# Patient Record
Sex: Female | Born: 1945 | Race: Black or African American | Hispanic: No | Smoking: Never smoker
Health system: Southern US, Community
[De-identification: ages and names within clinical notes are randomized; demographics above are authoritative.]

## PROBLEM LIST (undated history)

## (undated) DIAGNOSIS — C50919 Malignant neoplasm of unspecified site of unspecified female breast: Secondary | ICD-10-CM

## (undated) DIAGNOSIS — Z9884 Bariatric surgery status: Secondary | ICD-10-CM

## (undated) DIAGNOSIS — Z9889 Other specified postprocedural states: Secondary | ICD-10-CM

## (undated) DIAGNOSIS — C801 Malignant (primary) neoplasm, unspecified: Secondary | ICD-10-CM

## (undated) DIAGNOSIS — R7302 Impaired glucose tolerance (oral): Secondary | ICD-10-CM

## (undated) DIAGNOSIS — M797 Fibromyalgia: Secondary | ICD-10-CM

## (undated) DIAGNOSIS — M8580 Other specified disorders of bone density and structure, unspecified site: Secondary | ICD-10-CM

## (undated) DIAGNOSIS — Z124 Encounter for screening for malignant neoplasm of cervix: Secondary | ICD-10-CM

## (undated) DIAGNOSIS — I4891 Unspecified atrial fibrillation: Secondary | ICD-10-CM

## (undated) DIAGNOSIS — Z981 Arthrodesis status: Secondary | ICD-10-CM

## (undated) DIAGNOSIS — F319 Bipolar disorder, unspecified: Secondary | ICD-10-CM

## (undated) DIAGNOSIS — T7840XA Allergy, unspecified, initial encounter: Secondary | ICD-10-CM

## (undated) DIAGNOSIS — L910 Hypertrophic scar: Secondary | ICD-10-CM

## (undated) HISTORY — PX: MASTECTOMY: SHX3

## (undated) HISTORY — PX: AUGMENTATION MAMMAPLASTY: SHX5073

## (undated) HISTORY — DX: Arthrodesis status: Z98.1

## (undated) HISTORY — DX: Impaired glucose tolerance (oral): R73.02

## (undated) HISTORY — DX: Fibromyalgia: M79.7

## (undated) HISTORY — DX: Bipolar disorder, unspecified: F31.9

## (undated) HISTORY — DX: Bariatric surgery status: Z98.84

## (undated) HISTORY — PX: GASTRIC BYPASS: SHX52

## (undated) HISTORY — PX: MASTECTOMY, RADICAL: SHX710

## (undated) HISTORY — PX: RECONSTRUCTION BREAST W/ LATISSIMUS DORSI FLAP: SUR1078

## (undated) HISTORY — DX: Allergy, unspecified, initial encounter: T78.40XA

## (undated) HISTORY — DX: Malignant (primary) neoplasm, unspecified: C80.1

## (undated) HISTORY — DX: Other specified postprocedural states: Z98.890

## (undated) HISTORY — DX: Encounter for screening for malignant neoplasm of cervix: Z12.4

## (undated) HISTORY — DX: Hypertrophic scar: L91.0

## (undated) HISTORY — DX: Other specified disorders of bone density and structure, unspecified site: M85.80

---

## 1988-01-11 DIAGNOSIS — Z981 Arthrodesis status: Secondary | ICD-10-CM

## 1988-01-11 HISTORY — DX: Arthrodesis status: Z98.1

## 1996-11-21 ENCOUNTER — Other Ambulatory Visit (HOSPITAL_BASED_OUTPATIENT_CLINIC_OR_DEPARTMENT_OTHER): Payer: Self-pay

## 1996-11-26 LAB — CERVICAL CANCER SCREENING

## 1998-04-23 ENCOUNTER — Other Ambulatory Visit (HOSPITAL_BASED_OUTPATIENT_CLINIC_OR_DEPARTMENT_OTHER): Payer: Self-pay | Admitting: Internal Medicine

## 1998-05-05 LAB — CERVICAL CANCER SCREENING: Cytologic Impression: NEGATIVE

## 1998-11-05 ENCOUNTER — Ambulatory Visit (INDEPENDENT_AMBULATORY_CARE_PROVIDER_SITE_OTHER): Payer: Self-pay | Admitting: Hospice and Palliative Medicine

## 1999-04-05 ENCOUNTER — Ambulatory Visit (HOSPITAL_BASED_OUTPATIENT_CLINIC_OR_DEPARTMENT_OTHER): Payer: Self-pay

## 1999-04-26 ENCOUNTER — Encounter (HOSPITAL_BASED_OUTPATIENT_CLINIC_OR_DEPARTMENT_OTHER): Payer: Self-pay | Admitting: Dermatology

## 1999-05-12 ENCOUNTER — Encounter (HOSPITAL_BASED_OUTPATIENT_CLINIC_OR_DEPARTMENT_OTHER): Payer: Self-pay | Admitting: Dermatology

## 1999-05-24 ENCOUNTER — Encounter (HOSPITAL_BASED_OUTPATIENT_CLINIC_OR_DEPARTMENT_OTHER): Payer: Self-pay | Admitting: Dermatology

## 1999-05-26 ENCOUNTER — Encounter (HOSPITAL_BASED_OUTPATIENT_CLINIC_OR_DEPARTMENT_OTHER): Payer: Self-pay | Admitting: Dermatology

## 1999-05-31 ENCOUNTER — Encounter (HOSPITAL_BASED_OUTPATIENT_CLINIC_OR_DEPARTMENT_OTHER): Payer: Self-pay | Admitting: Dermatology

## 1999-06-30 ENCOUNTER — Encounter (HOSPITAL_BASED_OUTPATIENT_CLINIC_OR_DEPARTMENT_OTHER): Payer: Self-pay | Admitting: Dermatology

## 1999-07-28 ENCOUNTER — Encounter (HOSPITAL_BASED_OUTPATIENT_CLINIC_OR_DEPARTMENT_OTHER): Payer: Self-pay | Admitting: Dermatology

## 1999-08-12 ENCOUNTER — Encounter (HOSPITAL_BASED_OUTPATIENT_CLINIC_OR_DEPARTMENT_OTHER): Payer: No Typology Code available for payment source | Admitting: Internal Medicine

## 1999-09-14 ENCOUNTER — Encounter (HOSPITAL_BASED_OUTPATIENT_CLINIC_OR_DEPARTMENT_OTHER): Payer: Self-pay | Admitting: Internal Medicine

## 1999-09-15 ENCOUNTER — Encounter (HOSPITAL_BASED_OUTPATIENT_CLINIC_OR_DEPARTMENT_OTHER): Payer: No Typology Code available for payment source | Admitting: Dermatology

## 1999-09-22 ENCOUNTER — Other Ambulatory Visit (HOSPITAL_BASED_OUTPATIENT_CLINIC_OR_DEPARTMENT_OTHER): Payer: Self-pay

## 1999-09-22 ENCOUNTER — Encounter (HOSPITAL_BASED_OUTPATIENT_CLINIC_OR_DEPARTMENT_OTHER): Payer: No Typology Code available for payment source | Admitting: Internal Medicine

## 1999-09-28 LAB — CERVICAL CANCER SCREENING: Cytologic Impression: NEGATIVE

## 1999-11-12 ENCOUNTER — Encounter (HOSPITAL_BASED_OUTPATIENT_CLINIC_OR_DEPARTMENT_OTHER): Payer: No Typology Code available for payment source

## 1999-11-24 ENCOUNTER — Encounter (HOSPITAL_BASED_OUTPATIENT_CLINIC_OR_DEPARTMENT_OTHER): Payer: Self-pay | Admitting: Dermatology

## 1999-12-13 ENCOUNTER — Encounter (HOSPITAL_BASED_OUTPATIENT_CLINIC_OR_DEPARTMENT_OTHER): Payer: Self-pay | Admitting: Dermatology

## 1999-12-29 ENCOUNTER — Encounter (HOSPITAL_BASED_OUTPATIENT_CLINIC_OR_DEPARTMENT_OTHER): Payer: No Typology Code available for payment source | Admitting: Internal Medicine

## 1999-12-31 ENCOUNTER — Telehealth (INDEPENDENT_AMBULATORY_CARE_PROVIDER_SITE_OTHER): Payer: Self-pay | Admitting: Internal Medicine

## 1999-12-31 NOTE — Telephone Encounter (Signed)
>>   Mat Carne Fri Dec 31, 1999 4:27 PM  agree with plan.    >> DIANE CROSS Fri Dec 31, 1999 4:12 PM  New pt to [facility]. States she suddenly developed severe eye pain this   afternoon and eye became bloodshot.  She states when she woke up this morning she was fine; eyes did not itch or   burn, no redness or DC.  Rates her pain at 9/10 on pain scale, states she is also having difficulty   seeing out of the eye. She has a cold cloth over the eye to try and relieve   the pain.  Reviewed [facility] Donofrio for Eye Problems. Pt has sudden severe eye pain.  Advised to go to closest ER now for evaluation.  Keep appt tomorrow for f/u. [phone] (home)    >> [facility] Fri Dec 31, 1999 3:53 PM  Pt returning call for nurse re: below. Pt can be reached [phone] for   the rest of the afternoon.    >> PAT CARLTON Fri Dec 31, 1999 3:12 PM  LMTCB on both ph # vm    >> JESSICA WOOD Fri Dec 31, 1999 2:58 PM  >> CALL RECEIVED. Contact: PT  [place] Pt.  Pt states that her eye is very red and painful, and would like to know what she can do to alleviate the sx's until her appt w/ Dr. Domingo Pulse tomorrow afternoon. Pt may be reached at [phone] (home) or [phone] (cell)

## 2000-01-01 ENCOUNTER — Ambulatory Visit (INDEPENDENT_AMBULATORY_CARE_PROVIDER_SITE_OTHER): Payer: Self-pay | Admitting: Internal Medicine

## 2000-01-19 ENCOUNTER — Encounter (HOSPITAL_BASED_OUTPATIENT_CLINIC_OR_DEPARTMENT_OTHER): Payer: No Typology Code available for payment source

## 2000-02-22 ENCOUNTER — Encounter (HOSPITAL_BASED_OUTPATIENT_CLINIC_OR_DEPARTMENT_OTHER): Payer: Self-pay

## 2000-03-01 ENCOUNTER — Encounter (HOSPITAL_BASED_OUTPATIENT_CLINIC_OR_DEPARTMENT_OTHER): Payer: Self-pay | Admitting: Internal Medicine

## 2000-03-13 ENCOUNTER — Encounter (HOSPITAL_BASED_OUTPATIENT_CLINIC_OR_DEPARTMENT_OTHER): Payer: Self-pay | Admitting: Dermatology

## 2000-03-14 ENCOUNTER — Encounter (HOSPITAL_BASED_OUTPATIENT_CLINIC_OR_DEPARTMENT_OTHER): Payer: Self-pay | Admitting: Gynecologic Oncology

## 2000-03-27 ENCOUNTER — Encounter (HOSPITAL_BASED_OUTPATIENT_CLINIC_OR_DEPARTMENT_OTHER): Payer: Self-pay

## 2000-03-30 ENCOUNTER — Encounter (HOSPITAL_BASED_OUTPATIENT_CLINIC_OR_DEPARTMENT_OTHER): Payer: Self-pay

## 2000-03-31 ENCOUNTER — Encounter (HOSPITAL_BASED_OUTPATIENT_CLINIC_OR_DEPARTMENT_OTHER): Payer: Self-pay

## 2000-04-06 ENCOUNTER — Encounter: Payer: MEDICAID | Admitting: Internal Medicine

## 2000-04-17 ENCOUNTER — Encounter (HOSPITAL_BASED_OUTPATIENT_CLINIC_OR_DEPARTMENT_OTHER): Payer: Self-pay | Admitting: Internal Medicine

## 2000-04-24 ENCOUNTER — Encounter: Payer: Self-pay | Admitting: Internal Medicine

## 2000-05-08 ENCOUNTER — Encounter: Payer: Medicare Other | Admitting: Internal Medicine

## 2000-05-25 ENCOUNTER — Encounter: Payer: Medicare Other | Admitting: Psychiatry

## 2000-05-26 ENCOUNTER — Encounter: Payer: Self-pay | Admitting: Registered"

## 2000-06-08 ENCOUNTER — Encounter: Payer: Self-pay | Admitting: Psychiatry

## 2000-06-12 ENCOUNTER — Encounter: Payer: Medicare Other | Admitting: Internal Medicine

## 2000-06-26 ENCOUNTER — Encounter: Payer: Self-pay | Admitting: Internal Medicine

## 2000-06-28 ENCOUNTER — Encounter: Payer: Medicare Other | Admitting: Ophthalmology

## 2000-07-05 ENCOUNTER — Encounter: Payer: Self-pay | Admitting: Registered"

## 2000-09-12 ENCOUNTER — Encounter: Payer: Medicare Other | Admitting: Internal Medicine

## 2000-10-09 ENCOUNTER — Encounter: Payer: Self-pay | Admitting: Internal Medicine

## 2000-10-10 ENCOUNTER — Encounter: Payer: Self-pay | Admitting: Internal Medicine

## 2000-10-18 ENCOUNTER — Encounter: Payer: Medicare Other | Admitting: Internal Medicine

## 2000-10-27 ENCOUNTER — Ambulatory Visit: Payer: Medicare Other | Admitting: Registered"

## 2000-11-15 ENCOUNTER — Encounter: Payer: Self-pay | Admitting: Registered"

## 2000-11-15 ENCOUNTER — Encounter: Payer: Medicare Other | Admitting: Internal Medicine

## 2000-12-19 ENCOUNTER — Encounter: Payer: Self-pay | Admitting: Internal Medicine

## 2000-12-20 ENCOUNTER — Encounter: Payer: Medicare Other | Admitting: Internal Medicine

## 2001-01-10 DIAGNOSIS — C801 Malignant (primary) neoplasm, unspecified: Secondary | ICD-10-CM

## 2001-01-10 DIAGNOSIS — Z9884 Bariatric surgery status: Secondary | ICD-10-CM

## 2001-01-10 HISTORY — DX: Bariatric surgery status: Z98.84

## 2001-01-10 HISTORY — DX: Malignant (primary) neoplasm, unspecified: C80.1

## 2001-02-13 ENCOUNTER — Encounter: Payer: Medicare Other | Admitting: Internal Medicine

## 2001-03-20 ENCOUNTER — Encounter: Payer: Self-pay | Admitting: Internal Medicine

## 2001-03-28 ENCOUNTER — Encounter: Payer: Medicare Other | Admitting: Internal Medicine

## 2001-04-09 ENCOUNTER — Encounter: Payer: Self-pay | Admitting: Obstetrics & Gynecology

## 2001-04-17 ENCOUNTER — Encounter: Payer: Medicare Other | Admitting: Internal Medicine

## 2001-04-17 ENCOUNTER — Encounter: Payer: Medicare Other | Admitting: Registered Nurse

## 2001-05-03 ENCOUNTER — Encounter: Payer: Self-pay | Admitting: Optometrist

## 2001-05-10 ENCOUNTER — Encounter: Payer: Medicare Other | Admitting: Optometrist

## 2001-05-16 ENCOUNTER — Encounter: Payer: Self-pay | Admitting: Internal Medicine

## 2001-06-18 ENCOUNTER — Encounter: Payer: Medicare Other | Admitting: Optometrist

## 2001-09-17 ENCOUNTER — Ambulatory Visit: Payer: Medicare Other

## 2002-03-07 ENCOUNTER — Encounter: Payer: Medicare Other | Admitting: Obstetrics & Gynecology

## 2002-03-07 DIAGNOSIS — N951 Menopausal and female climacteric states: Secondary | ICD-10-CM

## 2002-03-07 DIAGNOSIS — D259 Leiomyoma of uterus, unspecified: Secondary | ICD-10-CM

## 2002-03-18 ENCOUNTER — Encounter (HOSPITAL_BASED_OUTPATIENT_CLINIC_OR_DEPARTMENT_OTHER): Payer: Self-pay | Admitting: Dermatology

## 2002-04-11 ENCOUNTER — Encounter: Payer: Medicare Other | Admitting: Obstetrics & Gynecology

## 2002-04-11 ENCOUNTER — Ambulatory Visit: Payer: Medicare Other

## 2002-04-11 DIAGNOSIS — D251 Intramural leiomyoma of uterus: Secondary | ICD-10-CM

## 2002-05-06 ENCOUNTER — Encounter (HOSPITAL_BASED_OUTPATIENT_CLINIC_OR_DEPARTMENT_OTHER): Payer: MEDICAID | Admitting: Dermatology

## 2002-10-23 ENCOUNTER — Ambulatory Visit (EMERGENCY_DEPARTMENT_HOSPITAL): Payer: Self-pay

## 2002-10-23 ENCOUNTER — Ambulatory Visit (HOSPITAL_BASED_OUTPATIENT_CLINIC_OR_DEPARTMENT_OTHER): Payer: MEDICAID

## 2002-10-23 DIAGNOSIS — R079 Chest pain, unspecified: Secondary | ICD-10-CM

## 2002-10-23 DIAGNOSIS — R002 Palpitations: Secondary | ICD-10-CM

## 2002-11-11 ENCOUNTER — Ambulatory Visit: Payer: Medicare Other

## 2002-11-11 DIAGNOSIS — Z1231 Encounter for screening mammogram for malignant neoplasm of breast: Secondary | ICD-10-CM

## 2002-11-14 ENCOUNTER — Ambulatory Visit: Payer: Medicare Other

## 2002-11-14 DIAGNOSIS — Z13820 Encounter for screening for osteoporosis: Secondary | ICD-10-CM

## 2002-12-02 ENCOUNTER — Other Ambulatory Visit: Payer: Medicare Other

## 2002-12-02 DIAGNOSIS — N63 Unspecified lump in unspecified breast: Secondary | ICD-10-CM

## 2003-12-10 ENCOUNTER — Other Ambulatory Visit: Payer: Medicare Other

## 2003-12-10 DIAGNOSIS — N63 Unspecified lump in unspecified breast: Secondary | ICD-10-CM

## 2003-12-10 DIAGNOSIS — R920 Mammographic microcalcification found on diagnostic imaging of breast: Secondary | ICD-10-CM

## 2004-01-02 ENCOUNTER — Encounter: Payer: PRIVATE HEALTH INSURANCE | Admitting: Optometrist

## 2004-01-02 ENCOUNTER — Encounter: Payer: Self-pay | Admitting: Optometrist

## 2004-01-02 DIAGNOSIS — H524 Presbyopia: Secondary | ICD-10-CM

## 2004-01-02 DIAGNOSIS — H5210 Myopia, unspecified eye: Secondary | ICD-10-CM

## 2004-02-05 ENCOUNTER — Encounter: Payer: PRIVATE HEALTH INSURANCE | Admitting: Obstetrics & Gynecology

## 2004-02-05 DIAGNOSIS — F329 Major depressive disorder, single episode, unspecified: Secondary | ICD-10-CM

## 2004-02-05 DIAGNOSIS — F3289 Other specified depressive episodes: Secondary | ICD-10-CM

## 2004-02-19 ENCOUNTER — Encounter: Payer: Medicare Other | Admitting: Obstetrics & Gynecology

## 2004-02-19 ENCOUNTER — Encounter: Payer: PRIVATE HEALTH INSURANCE | Admitting: Obstetrics & Gynecology

## 2004-02-19 DIAGNOSIS — F3289 Other specified depressive episodes: Secondary | ICD-10-CM

## 2004-02-19 DIAGNOSIS — F329 Major depressive disorder, single episode, unspecified: Secondary | ICD-10-CM

## 2004-03-17 ENCOUNTER — Encounter: Payer: PRIVATE HEALTH INSURANCE | Admitting: Dermatology

## 2004-03-17 DIAGNOSIS — L910 Hypertrophic scar: Secondary | ICD-10-CM

## 2004-05-19 ENCOUNTER — Encounter: Payer: PRIVATE HEALTH INSURANCE | Admitting: Dermatology

## 2006-01-10 DIAGNOSIS — M8580 Other specified disorders of bone density and structure, unspecified site: Secondary | ICD-10-CM

## 2006-01-10 HISTORY — PX: PR UNLISTED PROCEDURE BREAST: 19499

## 2006-01-10 HISTORY — DX: Other specified disorders of bone density and structure, unspecified site: M85.80

## 2006-01-28 LAB — HM MAMMOGRAPHY

## 2006-08-21 ENCOUNTER — Ambulatory Visit (EMERGENCY_DEPARTMENT_HOSPITAL): Payer: PRIVATE HEALTH INSURANCE

## 2006-08-21 DIAGNOSIS — R079 Chest pain, unspecified: Secondary | ICD-10-CM

## 2006-08-21 DIAGNOSIS — R42 Dizziness and giddiness: Secondary | ICD-10-CM

## 2006-08-21 DIAGNOSIS — Z853 Personal history of malignant neoplasm of breast: Secondary | ICD-10-CM

## 2006-08-21 DIAGNOSIS — R0789 Other chest pain: Secondary | ICD-10-CM

## 2006-08-21 DIAGNOSIS — R002 Palpitations: Secondary | ICD-10-CM

## 2006-09-13 ENCOUNTER — Encounter (HOSPITAL_BASED_OUTPATIENT_CLINIC_OR_DEPARTMENT_OTHER): Payer: Self-pay | Admitting: Dermatology

## 2006-09-26 ENCOUNTER — Other Ambulatory Visit (HOSPITAL_BASED_OUTPATIENT_CLINIC_OR_DEPARTMENT_OTHER): Payer: Self-pay | Admitting: Internal Medicine

## 2006-09-26 DIAGNOSIS — C50919 Malignant neoplasm of unspecified site of unspecified female breast: Secondary | ICD-10-CM

## 2006-09-26 DIAGNOSIS — N6039 Fibrosclerosis of unspecified breast: Secondary | ICD-10-CM

## 2006-09-28 LAB — PATHOLOGY, SURGICAL

## 2006-10-04 DIAGNOSIS — C50919 Malignant neoplasm of unspecified site of unspecified female breast: Secondary | ICD-10-CM

## 2006-10-06 ENCOUNTER — Encounter: Payer: PRIVATE HEALTH INSURANCE | Admitting: Internal Medicine

## 2006-10-06 DIAGNOSIS — C50919 Malignant neoplasm of unspecified site of unspecified female breast: Secondary | ICD-10-CM

## 2006-10-16 ENCOUNTER — Telehealth (HOSPITAL_BASED_OUTPATIENT_CLINIC_OR_DEPARTMENT_OTHER): Payer: PRIVATE HEALTH INSURANCE

## 2006-10-25 ENCOUNTER — Encounter (HOSPITAL_BASED_OUTPATIENT_CLINIC_OR_DEPARTMENT_OTHER): Payer: PRIVATE HEALTH INSURANCE

## 2006-10-25 ENCOUNTER — Encounter (HOSPITAL_COMMUNITY): Payer: PRIVATE HEALTH INSURANCE | Admitting: Surgery

## 2006-10-25 ENCOUNTER — Other Ambulatory Visit (HOSPITAL_BASED_OUTPATIENT_CLINIC_OR_DEPARTMENT_OTHER): Payer: Self-pay | Admitting: Surgery

## 2006-10-25 DIAGNOSIS — Z170 Estrogen receptor positive status [ER+]: Secondary | ICD-10-CM

## 2006-10-25 DIAGNOSIS — C50919 Malignant neoplasm of unspecified site of unspecified female breast: Secondary | ICD-10-CM

## 2006-10-25 DIAGNOSIS — C50219 Malignant neoplasm of upper-inner quadrant of unspecified female breast: Secondary | ICD-10-CM

## 2006-10-31 ENCOUNTER — Encounter (HOSPITAL_BASED_OUTPATIENT_CLINIC_OR_DEPARTMENT_OTHER): Payer: PRIVATE HEALTH INSURANCE | Admitting: Surgery

## 2006-10-31 DIAGNOSIS — Z483 Aftercare following surgery for neoplasm: Secondary | ICD-10-CM

## 2006-10-31 DIAGNOSIS — C50319 Malignant neoplasm of lower-inner quadrant of unspecified female breast: Secondary | ICD-10-CM

## 2006-10-31 DIAGNOSIS — Z9010 Acquired absence of unspecified breast and nipple: Secondary | ICD-10-CM

## 2006-11-23 ENCOUNTER — Encounter: Payer: PRIVATE HEALTH INSURANCE | Admitting: Internal Medicine

## 2006-11-23 ENCOUNTER — Other Ambulatory Visit: Payer: PRIVATE HEALTH INSURANCE

## 2006-11-23 DIAGNOSIS — C50919 Malignant neoplasm of unspecified site of unspecified female breast: Secondary | ICD-10-CM

## 2006-12-11 ENCOUNTER — Encounter: Payer: PRIVATE HEALTH INSURANCE | Admitting: Internal Medicine

## 2006-12-14 ENCOUNTER — Encounter: Payer: PRIVATE HEALTH INSURANCE | Admitting: Internal Medicine

## 2006-12-18 ENCOUNTER — Encounter: Payer: PRIVATE HEALTH INSURANCE | Admitting: Internal Medicine

## 2006-12-18 DIAGNOSIS — C50919 Malignant neoplasm of unspecified site of unspecified female breast: Secondary | ICD-10-CM

## 2007-01-11 DIAGNOSIS — Z9889 Other specified postprocedural states: Secondary | ICD-10-CM

## 2007-01-11 HISTORY — DX: Other specified postprocedural states: Z98.890

## 2007-01-29 LAB — HM COLONOSCOPY: HM Colonoscopy: NORMAL

## 2007-07-05 ENCOUNTER — Other Ambulatory Visit (HOSPITAL_BASED_OUTPATIENT_CLINIC_OR_DEPARTMENT_OTHER): Payer: PRIVATE HEALTH INSURANCE

## 2007-07-05 ENCOUNTER — Encounter (HOSPITAL_BASED_OUTPATIENT_CLINIC_OR_DEPARTMENT_OTHER): Payer: PRIVATE HEALTH INSURANCE | Admitting: Internal Medicine

## 2007-07-05 DIAGNOSIS — Z853 Personal history of malignant neoplasm of breast: Secondary | ICD-10-CM

## 2007-07-26 ENCOUNTER — Encounter (HOSPITAL_BASED_OUTPATIENT_CLINIC_OR_DEPARTMENT_OTHER): Payer: PRIVATE HEALTH INSURANCE | Admitting: Internal Medicine

## 2007-11-29 ENCOUNTER — Encounter (HOSPITAL_BASED_OUTPATIENT_CLINIC_OR_DEPARTMENT_OTHER): Payer: PRIVATE HEALTH INSURANCE | Admitting: Internal Medicine

## 2007-11-29 ENCOUNTER — Other Ambulatory Visit (HOSPITAL_BASED_OUTPATIENT_CLINIC_OR_DEPARTMENT_OTHER): Payer: PRIVATE HEALTH INSURANCE

## 2007-11-29 DIAGNOSIS — C50919 Malignant neoplasm of unspecified site of unspecified female breast: Secondary | ICD-10-CM

## 2007-12-19 ENCOUNTER — Ambulatory Visit (HOSPITAL_BASED_OUTPATIENT_CLINIC_OR_DEPARTMENT_OTHER): Payer: Self-pay

## 2008-01-25 ENCOUNTER — Ambulatory Visit (HOSPITAL_BASED_OUTPATIENT_CLINIC_OR_DEPARTMENT_OTHER): Payer: Medicare Other | Admitting: Physical Therapist

## 2008-02-21 ENCOUNTER — Ambulatory Visit (HOSPITAL_BASED_OUTPATIENT_CLINIC_OR_DEPARTMENT_OTHER): Payer: Medicare Other | Admitting: Physical Therapist

## 2008-02-22 ENCOUNTER — Ambulatory Visit (HOSPITAL_BASED_OUTPATIENT_CLINIC_OR_DEPARTMENT_OTHER): Payer: Medicare Other | Admitting: Physical Therapist

## 2008-02-28 ENCOUNTER — Ambulatory Visit (HOSPITAL_BASED_OUTPATIENT_CLINIC_OR_DEPARTMENT_OTHER): Payer: Medicare Other | Admitting: Physical Therapist

## 2008-02-29 ENCOUNTER — Ambulatory Visit (HOSPITAL_BASED_OUTPATIENT_CLINIC_OR_DEPARTMENT_OTHER): Payer: Medicare Other | Admitting: Physical Therapist

## 2008-03-06 ENCOUNTER — Ambulatory Visit (HOSPITAL_BASED_OUTPATIENT_CLINIC_OR_DEPARTMENT_OTHER): Payer: Medicare Other | Admitting: Physical Therapist

## 2008-03-07 ENCOUNTER — Ambulatory Visit (HOSPITAL_BASED_OUTPATIENT_CLINIC_OR_DEPARTMENT_OTHER): Payer: Medicare Other | Admitting: Physical Therapist

## 2008-03-13 ENCOUNTER — Ambulatory Visit (HOSPITAL_BASED_OUTPATIENT_CLINIC_OR_DEPARTMENT_OTHER): Payer: Medicare Other | Admitting: Physical Therapist

## 2008-03-14 ENCOUNTER — Ambulatory Visit (HOSPITAL_BASED_OUTPATIENT_CLINIC_OR_DEPARTMENT_OTHER): Payer: Medicare Other | Admitting: Physical Therapist

## 2008-03-21 ENCOUNTER — Ambulatory Visit (HOSPITAL_BASED_OUTPATIENT_CLINIC_OR_DEPARTMENT_OTHER): Payer: Medicare Other | Admitting: Physical Therapist

## 2008-03-28 ENCOUNTER — Ambulatory Visit (HOSPITAL_BASED_OUTPATIENT_CLINIC_OR_DEPARTMENT_OTHER): Payer: Self-pay | Admitting: Physical Therapist

## 2008-04-04 ENCOUNTER — Ambulatory Visit (HOSPITAL_BASED_OUTPATIENT_CLINIC_OR_DEPARTMENT_OTHER): Payer: Self-pay | Admitting: Physical Therapist

## 2008-04-08 ENCOUNTER — Ambulatory Visit (HOSPITAL_BASED_OUTPATIENT_CLINIC_OR_DEPARTMENT_OTHER): Payer: Medicare Other

## 2008-04-11 ENCOUNTER — Ambulatory Visit (HOSPITAL_BASED_OUTPATIENT_CLINIC_OR_DEPARTMENT_OTHER): Payer: Medicare Other | Admitting: Physical Therapist

## 2008-04-15 ENCOUNTER — Ambulatory Visit (HOSPITAL_BASED_OUTPATIENT_CLINIC_OR_DEPARTMENT_OTHER): Payer: Medicare Other

## 2008-04-16 ENCOUNTER — Other Ambulatory Visit: Payer: Self-pay | Admitting: Internal Medicine

## 2008-04-16 ENCOUNTER — Other Ambulatory Visit (HOSPITAL_BASED_OUTPATIENT_CLINIC_OR_DEPARTMENT_OTHER): Payer: Medicare Other

## 2008-04-16 DIAGNOSIS — N63 Unspecified lump in unspecified breast: Secondary | ICD-10-CM

## 2008-04-16 LAB — US EXAM, BREAST(S)

## 2008-04-18 ENCOUNTER — Other Ambulatory Visit (HOSPITAL_BASED_OUTPATIENT_CLINIC_OR_DEPARTMENT_OTHER): Payer: Self-pay | Admitting: Diagnostic Radiology

## 2008-04-18 ENCOUNTER — Ambulatory Visit (HOSPITAL_BASED_OUTPATIENT_CLINIC_OR_DEPARTMENT_OTHER): Payer: Medicare Other | Admitting: Physical Therapist

## 2008-04-18 ENCOUNTER — Encounter (HOSPITAL_BASED_OUTPATIENT_CLINIC_OR_DEPARTMENT_OTHER): Payer: Medicare Other

## 2008-04-18 ENCOUNTER — Other Ambulatory Visit: Payer: Self-pay | Admitting: Internal Medicine

## 2008-04-18 DIAGNOSIS — N61 Mastitis without abscess: Secondary | ICD-10-CM

## 2008-04-18 DIAGNOSIS — C50919 Malignant neoplasm of unspecified site of unspecified female breast: Secondary | ICD-10-CM

## 2008-04-18 DIAGNOSIS — N6009 Solitary cyst of unspecified breast: Secondary | ICD-10-CM

## 2008-04-18 DIAGNOSIS — N63 Unspecified lump in unspecified breast: Secondary | ICD-10-CM

## 2008-04-21 LAB — PATHOLOGY, SURGICAL

## 2008-04-22 LAB — PR US BREAST FINE NEEDLE ASPIRATE

## 2008-04-22 LAB — PR SONO GUIDE NEEDLE BIOPSY

## 2008-04-23 ENCOUNTER — Other Ambulatory Visit: Payer: Self-pay | Admitting: Internal Medicine

## 2008-04-23 ENCOUNTER — Other Ambulatory Visit (HOSPITAL_BASED_OUTPATIENT_CLINIC_OR_DEPARTMENT_OTHER): Payer: Medicare Other

## 2008-04-23 ENCOUNTER — Ambulatory Visit (HOSPITAL_BASED_OUTPATIENT_CLINIC_OR_DEPARTMENT_OTHER): Payer: Medicare Other

## 2008-04-23 DIAGNOSIS — Z853 Personal history of malignant neoplasm of breast: Secondary | ICD-10-CM

## 2008-04-23 DIAGNOSIS — C50919 Malignant neoplasm of unspecified site of unspecified female breast: Secondary | ICD-10-CM

## 2008-04-23 DIAGNOSIS — R928 Other abnormal and inconclusive findings on diagnostic imaging of breast: Secondary | ICD-10-CM

## 2008-04-24 LAB — MRI, BOTH BREASTS

## 2008-04-25 ENCOUNTER — Ambulatory Visit (HOSPITAL_BASED_OUTPATIENT_CLINIC_OR_DEPARTMENT_OTHER): Payer: Medicare Other | Admitting: Physical Therapist

## 2008-04-27 ENCOUNTER — Ambulatory Visit (EMERGENCY_DEPARTMENT_HOSPITAL): Payer: Medicare Other

## 2008-04-27 DIAGNOSIS — IMO0002 Reserved for concepts with insufficient information to code with codable children: Secondary | ICD-10-CM

## 2008-04-27 DIAGNOSIS — C50919 Malignant neoplasm of unspecified site of unspecified female breast: Secondary | ICD-10-CM

## 2008-04-29 ENCOUNTER — Encounter (HOSPITAL_BASED_OUTPATIENT_CLINIC_OR_DEPARTMENT_OTHER): Payer: Medicare Other | Admitting: Surgery

## 2008-04-29 ENCOUNTER — Ambulatory Visit (HOSPITAL_BASED_OUTPATIENT_CLINIC_OR_DEPARTMENT_OTHER): Payer: Medicare Other

## 2008-04-29 DIAGNOSIS — C50119 Malignant neoplasm of central portion of unspecified female breast: Secondary | ICD-10-CM

## 2008-05-02 ENCOUNTER — Ambulatory Visit (HOSPITAL_BASED_OUTPATIENT_CLINIC_OR_DEPARTMENT_OTHER): Payer: Medicare Other | Admitting: Physical Therapist

## 2008-05-06 ENCOUNTER — Ambulatory Visit (HOSPITAL_BASED_OUTPATIENT_CLINIC_OR_DEPARTMENT_OTHER): Payer: Medicare Other

## 2008-05-13 ENCOUNTER — Other Ambulatory Visit (HOSPITAL_BASED_OUTPATIENT_CLINIC_OR_DEPARTMENT_OTHER): Payer: Self-pay | Admitting: Surgery

## 2008-05-13 ENCOUNTER — Encounter (HOSPITAL_BASED_OUTPATIENT_CLINIC_OR_DEPARTMENT_OTHER): Payer: Medicare Other | Admitting: Surgery

## 2008-05-13 DIAGNOSIS — C50919 Malignant neoplasm of unspecified site of unspecified female breast: Secondary | ICD-10-CM

## 2008-05-13 DIAGNOSIS — C50119 Malignant neoplasm of central portion of unspecified female breast: Secondary | ICD-10-CM

## 2008-05-22 ENCOUNTER — Other Ambulatory Visit (HOSPITAL_BASED_OUTPATIENT_CLINIC_OR_DEPARTMENT_OTHER): Payer: Self-pay | Admitting: Diagnostic Radiology

## 2008-05-22 ENCOUNTER — Other Ambulatory Visit (HOSPITAL_BASED_OUTPATIENT_CLINIC_OR_DEPARTMENT_OTHER): Payer: Medicare Other

## 2008-05-22 ENCOUNTER — Other Ambulatory Visit: Payer: Self-pay | Admitting: Surgery

## 2008-05-22 ENCOUNTER — Encounter (HOSPITAL_BASED_OUTPATIENT_CLINIC_OR_DEPARTMENT_OTHER): Payer: Medicare Other

## 2008-05-22 DIAGNOSIS — N6459 Other signs and symptoms in breast: Secondary | ICD-10-CM

## 2008-05-22 DIAGNOSIS — N63 Unspecified lump in unspecified breast: Secondary | ICD-10-CM

## 2008-05-22 DIAGNOSIS — Z853 Personal history of malignant neoplasm of breast: Secondary | ICD-10-CM

## 2008-05-22 DIAGNOSIS — R928 Other abnormal and inconclusive findings on diagnostic imaging of breast: Secondary | ICD-10-CM

## 2008-05-22 LAB — US EXAM, BREAST(S)

## 2008-05-23 LAB — PATHOLOGY, SURGICAL

## 2008-06-06 LAB — MAMMOGRAM, RIGHT BREAST DIAGNOSTIC

## 2008-06-06 LAB — PR SONO GUIDE NEEDLE BIOPSY

## 2008-06-17 ENCOUNTER — Encounter (HOSPITAL_BASED_OUTPATIENT_CLINIC_OR_DEPARTMENT_OTHER): Payer: Medicare Other | Admitting: Surgery

## 2008-06-17 ENCOUNTER — Encounter (HOSPITAL_BASED_OUTPATIENT_CLINIC_OR_DEPARTMENT_OTHER): Payer: Medicare Other | Admitting: Plastic and Reconstructive Surgery

## 2008-06-20 ENCOUNTER — Encounter (HOSPITAL_BASED_OUTPATIENT_CLINIC_OR_DEPARTMENT_OTHER): Payer: Medicare Other | Admitting: Surgery

## 2008-06-20 ENCOUNTER — Encounter (HOSPITAL_BASED_OUTPATIENT_CLINIC_OR_DEPARTMENT_OTHER): Payer: Medicare Other | Admitting: Plastic and Reconstructive Surgery

## 2008-06-24 ENCOUNTER — Encounter (HOSPITAL_BASED_OUTPATIENT_CLINIC_OR_DEPARTMENT_OTHER): Payer: Medicare Other | Admitting: Surgery

## 2008-07-31 ENCOUNTER — Ambulatory Visit (HOSPITAL_BASED_OUTPATIENT_CLINIC_OR_DEPARTMENT_OTHER): Admit: 2008-07-31 | Discharge: 2008-07-31 | Disposition: A | Discharge status: Home / Self Care | Payer: Self-pay

## 2008-11-27 ENCOUNTER — Ambulatory Visit: Payer: Medicare Other | Attending: Surgery

## 2008-11-27 ENCOUNTER — Other Ambulatory Visit: Payer: Self-pay | Admitting: Surgery

## 2008-11-27 DIAGNOSIS — Z853 Personal history of malignant neoplasm of breast: Secondary | ICD-10-CM | POA: Insufficient documentation

## 2008-11-29 LAB — US EXAM, BREAST(S)

## 2009-07-10 DIAGNOSIS — Z124 Encounter for screening for malignant neoplasm of cervix: Secondary | ICD-10-CM

## 2009-07-10 HISTORY — DX: Encounter for screening for malignant neoplasm of cervix: Z12.4

## 2009-07-28 LAB — HM PAP SMEAR: HM Pap smear: NORMAL

## 2009-08-24 ENCOUNTER — Ambulatory Visit: Payer: Medicare Other | Attending: Internal Medicine | Admitting: Internal Medicine

## 2009-08-24 DIAGNOSIS — M2550 Pain in unspecified joint: Secondary | ICD-10-CM | POA: Insufficient documentation

## 2009-08-24 DIAGNOSIS — F329 Major depressive disorder, single episode, unspecified: Secondary | ICD-10-CM | POA: Insufficient documentation

## 2009-08-24 DIAGNOSIS — C50919 Malignant neoplasm of unspecified site of unspecified female breast: Secondary | ICD-10-CM

## 2009-08-24 DIAGNOSIS — Z79811 Long term (current) use of aromatase inhibitors: Secondary | ICD-10-CM | POA: Insufficient documentation

## 2009-08-24 DIAGNOSIS — Z9010 Acquired absence of unspecified breast and nipple: Secondary | ICD-10-CM | POA: Insufficient documentation

## 2009-08-24 DIAGNOSIS — IMO0001 Reserved for inherently not codable concepts without codable children: Secondary | ICD-10-CM | POA: Insufficient documentation

## 2009-08-24 DIAGNOSIS — Z853 Personal history of malignant neoplasm of breast: Secondary | ICD-10-CM | POA: Insufficient documentation

## 2009-08-24 DIAGNOSIS — F3289 Other specified depressive episodes: Secondary | ICD-10-CM | POA: Insufficient documentation

## 2009-08-24 DIAGNOSIS — N951 Menopausal and female climacteric states: Secondary | ICD-10-CM | POA: Insufficient documentation

## 2009-08-24 DIAGNOSIS — Z978 Presence of other specified devices: Secondary | ICD-10-CM | POA: Insufficient documentation

## 2009-08-30 ENCOUNTER — Other Ambulatory Visit: Payer: Self-pay

## 2009-09-06 ENCOUNTER — Other Ambulatory Visit: Payer: Self-pay

## 2009-10-31 ENCOUNTER — Other Ambulatory Visit: Payer: Self-pay

## 2009-11-24 ENCOUNTER — Encounter (HOSPITAL_BASED_OUTPATIENT_CLINIC_OR_DEPARTMENT_OTHER): Payer: Medicare Other | Admitting: Internal Medicine

## 2009-11-30 ENCOUNTER — Other Ambulatory Visit: Payer: Self-pay

## 2009-12-01 ENCOUNTER — Ambulatory Visit: Payer: Medicare Other | Attending: Internal Medicine

## 2009-12-01 ENCOUNTER — Encounter (HOSPITAL_BASED_OUTPATIENT_CLINIC_OR_DEPARTMENT_OTHER): Payer: Medicare Other | Admitting: Internal Medicine

## 2009-12-01 ENCOUNTER — Other Ambulatory Visit: Payer: Self-pay | Admitting: Internal Medicine

## 2009-12-01 ENCOUNTER — Ambulatory Visit (HOSPITAL_BASED_OUTPATIENT_CLINIC_OR_DEPARTMENT_OTHER): Payer: Medicare Other

## 2009-12-01 DIAGNOSIS — Z853 Personal history of malignant neoplasm of breast: Secondary | ICD-10-CM

## 2009-12-01 DIAGNOSIS — C50919 Malignant neoplasm of unspecified site of unspecified female breast: Secondary | ICD-10-CM | POA: Insufficient documentation

## 2009-12-01 LAB — MAMMOGRAM, RIGHT BREAST DIAGNOSTIC

## 2010-01-01 ENCOUNTER — Other Ambulatory Visit: Payer: Self-pay

## 2010-01-02 ENCOUNTER — Other Ambulatory Visit: Payer: Self-pay

## 2010-01-03 ENCOUNTER — Other Ambulatory Visit: Payer: Self-pay

## 2010-01-04 ENCOUNTER — Other Ambulatory Visit: Payer: Self-pay

## 2010-01-05 ENCOUNTER — Other Ambulatory Visit: Payer: Self-pay

## 2010-01-06 ENCOUNTER — Other Ambulatory Visit: Payer: Self-pay

## 2010-01-07 ENCOUNTER — Other Ambulatory Visit: Payer: Self-pay

## 2010-01-08 ENCOUNTER — Other Ambulatory Visit: Payer: Self-pay

## 2010-01-09 ENCOUNTER — Other Ambulatory Visit: Payer: Self-pay

## 2010-01-10 ENCOUNTER — Other Ambulatory Visit: Payer: Self-pay

## 2010-01-11 ENCOUNTER — Other Ambulatory Visit: Payer: Self-pay

## 2010-01-12 ENCOUNTER — Other Ambulatory Visit: Payer: Self-pay

## 2010-01-13 ENCOUNTER — Other Ambulatory Visit: Payer: Self-pay

## 2010-01-14 ENCOUNTER — Other Ambulatory Visit: Payer: Self-pay

## 2010-01-15 ENCOUNTER — Other Ambulatory Visit: Payer: Self-pay

## 2010-01-16 ENCOUNTER — Other Ambulatory Visit: Payer: Self-pay

## 2010-01-17 ENCOUNTER — Other Ambulatory Visit: Payer: Self-pay

## 2010-01-18 ENCOUNTER — Other Ambulatory Visit: Payer: Self-pay

## 2010-01-19 ENCOUNTER — Other Ambulatory Visit: Payer: Self-pay

## 2010-01-21 ENCOUNTER — Other Ambulatory Visit: Payer: Self-pay

## 2010-01-23 ENCOUNTER — Other Ambulatory Visit: Payer: Self-pay

## 2010-01-24 ENCOUNTER — Other Ambulatory Visit: Payer: Self-pay

## 2010-01-25 ENCOUNTER — Other Ambulatory Visit: Payer: Self-pay

## 2010-01-27 ENCOUNTER — Other Ambulatory Visit: Payer: Self-pay

## 2010-01-28 ENCOUNTER — Other Ambulatory Visit: Payer: Self-pay

## 2010-01-28 LAB — HM MAMMOGRAPHY

## 2010-01-29 ENCOUNTER — Other Ambulatory Visit: Payer: Self-pay

## 2010-02-02 ENCOUNTER — Ambulatory Visit: Payer: Medicare Other | Attending: Internal Medicine | Admitting: Internal Medicine

## 2010-02-02 DIAGNOSIS — R928 Other abnormal and inconclusive findings on diagnostic imaging of breast: Secondary | ICD-10-CM | POA: Insufficient documentation

## 2010-02-02 DIAGNOSIS — F329 Major depressive disorder, single episode, unspecified: Secondary | ICD-10-CM | POA: Insufficient documentation

## 2010-02-02 DIAGNOSIS — Z79811 Long term (current) use of aromatase inhibitors: Secondary | ICD-10-CM | POA: Insufficient documentation

## 2010-02-02 DIAGNOSIS — F3289 Other specified depressive episodes: Secondary | ICD-10-CM | POA: Insufficient documentation

## 2010-02-02 DIAGNOSIS — C50919 Malignant neoplasm of unspecified site of unspecified female breast: Secondary | ICD-10-CM

## 2010-02-02 DIAGNOSIS — D0590 Unspecified type of carcinoma in situ of unspecified breast: Secondary | ICD-10-CM | POA: Insufficient documentation

## 2010-02-02 DIAGNOSIS — L910 Hypertrophic scar: Secondary | ICD-10-CM | POA: Insufficient documentation

## 2010-02-16 ENCOUNTER — Other Ambulatory Visit (HOSPITAL_BASED_OUTPATIENT_CLINIC_OR_DEPARTMENT_OTHER): Payer: Medicare Other

## 2010-02-19 ENCOUNTER — Ambulatory Visit: Payer: Medicare Other | Attending: Plastic and Reconstructive Surgery | Admitting: Plastic and Reconstructive Surgery

## 2010-02-19 DIAGNOSIS — L910 Hypertrophic scar: Secondary | ICD-10-CM | POA: Insufficient documentation

## 2010-03-08 ENCOUNTER — Encounter (HOSPITAL_BASED_OUTPATIENT_CLINIC_OR_DEPARTMENT_OTHER): Payer: Self-pay

## 2010-03-18 ENCOUNTER — Ambulatory Visit: Payer: Medicare Other | Attending: Plastic and Reconstructive Surgery | Admitting: Plastic and Reconstructive Surgery

## 2010-03-18 DIAGNOSIS — L910 Hypertrophic scar: Secondary | ICD-10-CM | POA: Insufficient documentation

## 2010-04-23 ENCOUNTER — Ambulatory Visit: Payer: Medicare Other | Attending: Plastic and Reconstructive Surgery | Admitting: Plastic and Reconstructive Surgery

## 2010-04-23 DIAGNOSIS — L910 Hypertrophic scar: Secondary | ICD-10-CM | POA: Insufficient documentation

## 2010-04-27 ENCOUNTER — Ambulatory Visit (HOSPITAL_BASED_OUTPATIENT_CLINIC_OR_DEPARTMENT_OTHER): Payer: Medicare Other | Admitting: Plastic and Reconstructive Surgery

## 2010-04-28 ENCOUNTER — Ambulatory Visit (HOSPITAL_BASED_OUTPATIENT_CLINIC_OR_DEPARTMENT_OTHER): Payer: Medicare Other | Admitting: Plastic and Reconstructive Surgery

## 2010-05-12 ENCOUNTER — Ambulatory Visit: Payer: Medicare Other | Attending: Plastic and Reconstructive Surgery | Admitting: Plastic and Reconstructive Surgery

## 2010-05-12 DIAGNOSIS — L910 Hypertrophic scar: Secondary | ICD-10-CM | POA: Insufficient documentation

## 2010-05-28 ENCOUNTER — Ambulatory Visit: Payer: Medicare Other | Attending: Plastic and Reconstructive Surgery | Admitting: Plastic and Reconstructive Surgery

## 2010-05-28 DIAGNOSIS — L910 Hypertrophic scar: Secondary | ICD-10-CM | POA: Insufficient documentation

## 2010-06-09 ENCOUNTER — Other Ambulatory Visit (HOSPITAL_BASED_OUTPATIENT_CLINIC_OR_DEPARTMENT_OTHER): Payer: Self-pay | Admitting: Plastic and Reconstructive Surgery

## 2010-06-09 ENCOUNTER — Ambulatory Visit (HOSPITAL_BASED_OUTPATIENT_CLINIC_OR_DEPARTMENT_OTHER): Payer: Medicare Other | Admitting: Plastic and Reconstructive Surgery

## 2010-06-09 ENCOUNTER — Ambulatory Visit
Admission: RE | Admit: 2010-06-09 | Discharge: 2010-06-09 | Disposition: A | Discharge status: Home / Self Care | Payer: Medicare Other | Attending: Plastic and Reconstructive Surgery | Admitting: Plastic and Reconstructive Surgery

## 2010-06-09 DIAGNOSIS — L910 Hypertrophic scar: Secondary | ICD-10-CM

## 2010-06-09 DIAGNOSIS — L905 Scar conditions and fibrosis of skin: Secondary | ICD-10-CM | POA: Insufficient documentation

## 2010-06-09 DIAGNOSIS — F411 Generalized anxiety disorder: Secondary | ICD-10-CM | POA: Insufficient documentation

## 2010-06-09 DIAGNOSIS — Z853 Personal history of malignant neoplasm of breast: Secondary | ICD-10-CM | POA: Insufficient documentation

## 2010-06-09 DIAGNOSIS — F329 Major depressive disorder, single episode, unspecified: Secondary | ICD-10-CM | POA: Insufficient documentation

## 2010-06-09 DIAGNOSIS — F3289 Other specified depressive episodes: Secondary | ICD-10-CM | POA: Insufficient documentation

## 2010-06-11 LAB — PATHOLOGY, SURGICAL

## 2010-06-18 ENCOUNTER — Ambulatory Visit: Payer: Medicare Other | Attending: Plastic and Reconstructive Surgery | Admitting: Plastic and Reconstructive Surgery

## 2010-06-18 DIAGNOSIS — L910 Hypertrophic scar: Secondary | ICD-10-CM

## 2010-06-18 DIAGNOSIS — Z9884 Bariatric surgery status: Secondary | ICD-10-CM | POA: Insufficient documentation

## 2010-06-18 DIAGNOSIS — Z978 Presence of other specified devices: Secondary | ICD-10-CM | POA: Insufficient documentation

## 2010-06-22 ENCOUNTER — Ambulatory Visit (HOSPITAL_BASED_OUTPATIENT_CLINIC_OR_DEPARTMENT_OTHER): Payer: Medicare Other | Admitting: Internal Medicine

## 2010-06-22 ENCOUNTER — Other Ambulatory Visit (HOSPITAL_BASED_OUTPATIENT_CLINIC_OR_DEPARTMENT_OTHER): Payer: Self-pay

## 2010-06-22 ENCOUNTER — Ambulatory Visit: Payer: Medicare Other | Attending: Internal Medicine

## 2010-06-22 DIAGNOSIS — Z978 Presence of other specified devices: Secondary | ICD-10-CM | POA: Insufficient documentation

## 2010-06-22 DIAGNOSIS — Z9010 Acquired absence of unspecified breast and nipple: Secondary | ICD-10-CM | POA: Insufficient documentation

## 2010-06-22 DIAGNOSIS — Z170 Estrogen receptor positive status [ER+]: Secondary | ICD-10-CM | POA: Insufficient documentation

## 2010-06-22 DIAGNOSIS — Z79811 Long term (current) use of aromatase inhibitors: Secondary | ICD-10-CM | POA: Insufficient documentation

## 2010-06-22 DIAGNOSIS — C50919 Malignant neoplasm of unspecified site of unspecified female breast: Secondary | ICD-10-CM

## 2010-06-22 DIAGNOSIS — Z923 Personal history of irradiation: Secondary | ICD-10-CM | POA: Insufficient documentation

## 2010-06-22 DIAGNOSIS — C50319 Malignant neoplasm of lower-inner quadrant of unspecified female breast: Secondary | ICD-10-CM | POA: Insufficient documentation

## 2010-06-22 LAB — CBC, DIFF
% Basophils: 1 % (ref 0–1)
% Eosinophils: 1 % (ref 0–7)
% Immature Granulocytes: 0 % (ref 0–1)
% Lymphocytes: 42 % (ref 19–53)
% Monocytes: 4 % — ABNORMAL LOW (ref 5–13)
% Neutrophils: 52 % (ref 34–71)
Absolute Eosinophil Count: 0.06 10*3/uL (ref 0.00–0.50)
Absolute Lymphocyte Count: 1.91 10*3/uL (ref 1.00–4.80)
Basophils: 0.03 10*3/uL (ref 0.00–0.20)
Hematocrit: 34 % — ABNORMAL LOW (ref 36–45)
Hemoglobin: 11 g/dL — ABNORMAL LOW (ref 11.5–15.5)
Immature Granulocytes: 0.01 10*3/uL (ref 0.00–0.05)
MCH: 27.6 pg (ref 27.3–33.6)
MCHC: 32.3 g/dL (ref 32.2–36.5)
MCV: 86 fL (ref 81–98)
Monocytes: 0.16 10*3/uL (ref 0.00–0.80)
Neutrophils: 2.43 10*3/uL (ref 1.80–7.00)
Platelet Count: 198 10*3/uL (ref 150–400)
RBC: 3.99 mil/uL (ref 3.80–5.00)
RDW-CV: 16.3 % — ABNORMAL HIGH (ref 11.6–14.4)
WBC: 4.6 10*3/uL (ref 4.3–10.0)

## 2010-06-22 LAB — COMP METABOLIC PANEL
ALT (GPT): 10 U/L (ref 6–40)
AST (GOT): 17 U/L (ref 15–40)
Albumin/Globulin Ratio: 1 (ref 1.0–2.4)
Albumin: 3.7 g/dL (ref 3.5–5.2)
Alkaline Phosphatase (Total): 80 U/L (ref 31–132)
Anion Gap: 4 (ref 3–11)
Bilirubin (Total): 0.4 mg/dL (ref 0.2–1.3)
Calcium: 9.2 mg/dL (ref 8.9–10.2)
Carbon Dioxide, Total: 30 mEq/L (ref 22–32)
Chloride: 103 mEq/L (ref 98–108)
Creatinine: 0.89 mg/dL (ref 0.38–1.02)
GFR, Calc, African American: >60 mL/min (ref >59)
GFR, Calc, European American: >60 mL/min (ref >59)
Globulin: 3.7 g/dL (ref 2.0–3.9)
Glucose: 142 mg/dL — ABNORMAL HIGH (ref 62–125)
Potassium: 4.2 mEq/L (ref 3.7–5.2)
Protein (Total): 7.4 g/dL (ref 6.0–8.2)
Sodium: 137 mEq/L (ref 136–145)
Urea Nitrogen: 11 mg/dL (ref 8–21)

## 2010-06-22 LAB — LIPID PANEL
Cholesterol (LDL): 76 mg/dL (ref <130)
Cholesterol/HDL Ratio: 2.3
HDL Cholesterol: 74 mg/dL (ref >40)
Non-HDL Cholesterol: 95 mg/dL (ref 0–159)
Total Cholesterol: 169 mg/dL (ref <200)
Triglyceride: 95 mg/dL (ref <150)

## 2010-06-24 LAB — VITAMIN D (25 HYDROXY)
Vit D (25_Hydroxy) Total: 28.6 ng/mL (ref 20.1–50.0)
Vitamin D2 (25_Hydroxy): 11.7 ng/mL
Vitamin D3 (25_Hydroxy): 16.9 ng/mL

## 2010-07-01 ENCOUNTER — Ambulatory Visit (HOSPITAL_BASED_OUTPATIENT_CLINIC_OR_DEPARTMENT_OTHER): Payer: Medicare Other | Admitting: Plastic and Reconstructive Surgery

## 2010-07-01 ENCOUNTER — Ambulatory Visit: Payer: Medicare Other | Attending: Plastic and Reconstructive Surgery | Admitting: Plastic and Reconstructive Surgery

## 2010-07-01 DIAGNOSIS — Z9884 Bariatric surgery status: Secondary | ICD-10-CM | POA: Insufficient documentation

## 2010-07-01 DIAGNOSIS — L910 Hypertrophic scar: Secondary | ICD-10-CM | POA: Insufficient documentation

## 2010-07-01 DIAGNOSIS — Z978 Presence of other specified devices: Secondary | ICD-10-CM | POA: Insufficient documentation

## 2010-07-01 DIAGNOSIS — Z48817 Encounter for surgical aftercare following surgery on the skin and subcutaneous tissue: Secondary | ICD-10-CM | POA: Insufficient documentation

## 2010-11-01 ENCOUNTER — Ambulatory Visit: Payer: Self-pay | Admitting: Internal Medicine

## 2010-11-03 ENCOUNTER — Ambulatory Visit: Payer: Self-pay | Admitting: Internal Medicine

## 2010-11-11 ENCOUNTER — Ambulatory Visit: Payer: Self-pay | Admitting: Internal Medicine

## 2010-11-12 ENCOUNTER — Encounter: Payer: Self-pay | Admitting: Internal Medicine

## 2010-11-12 ENCOUNTER — Ambulatory Visit (INDEPENDENT_AMBULATORY_CARE_PROVIDER_SITE_OTHER): Payer: Medicare Other | Admitting: Internal Medicine

## 2010-11-12 DIAGNOSIS — IMO0001 Reserved for inherently not codable concepts without codable children: Secondary | ICD-10-CM

## 2010-11-12 DIAGNOSIS — M797 Fibromyalgia: Secondary | ICD-10-CM

## 2010-11-12 DIAGNOSIS — L910 Hypertrophic scar: Secondary | ICD-10-CM | POA: Insufficient documentation

## 2010-11-12 DIAGNOSIS — Z853 Personal history of malignant neoplasm of breast: Secondary | ICD-10-CM

## 2010-11-12 DIAGNOSIS — Z9884 Bariatric surgery status: Secondary | ICD-10-CM

## 2010-11-12 DIAGNOSIS — Z124 Encounter for screening for malignant neoplasm of cervix: Secondary | ICD-10-CM

## 2010-11-12 MED ORDER — OXYCODONE HCL 30 MG PO TABS: 30.0000 mg | ORAL_TABLET | ORAL | Status: DC | PRN

## 2010-11-12 MED ORDER — EXEMESTANE 25 MG PO TABS: 25.0000 mg | ORAL_TABLET | Freq: Every day | ORAL | Status: DC

## 2010-11-12 NOTE — Patient Instructions (Signed)
We are getting your mammogram scheduled and [facility] to followup on your breast cancer.   We'll get an ECHO of your heart to check your murmur.  We will find out a Engineer, petroleum in [place] to manage your keloids  We are referring you to a rheumatologist for your fibromyalgia.,

## 2010-11-12 NOTE — Progress Notes (Signed)
  Subjective:    Patient ID: Kim Hopkins, female    DOB: [birth year], 65 y.o.   MRN: [id number]  HPI  [name] is a 65 yo AA  Female with a history of breast cancer who is here to establish care.  She recently relocated from [place] in July because of a 20 yr search for her estranged father ended here .  Apparently he is quite old and she has decided not to tell him of her diagosis of breast cancer.  She was diagnosed  2008,  s/p left mastectomy and  breast lap reconstruction and cosmetic implant on the right , all complicated by deveopment of painful keloids.  She takes Aromasin,.  Her keloids have been treated with injections by plastic surgeon.   She has osteopenia by bone desntiy 2012, managed with  calcium and D.   She has fibromyalgia, managed with oxycodone 30 mg three to four times daily .Marland Kitchen Colonoscopy 2009 normal,  Due again in 2019.  No history of stress test.        Review of Systems  Constitutional: Negative for fever, chills and unexpected weight change.  HENT: Negative for hearing loss, ear pain, nosebleeds, congestion, sore throat, facial swelling, rhinorrhea, sneezing, mouth sores, trouble swallowing, neck pain, neck stiffness, voice change, postnasal drip, sinus pressure, tinnitus and ear discharge.   Eyes: Negative for pain, discharge, redness and visual disturbance.  Respiratory: Negative for cough, chest tightness, shortness of breath, wheezing and stridor.   Cardiovascular: Negative for chest pain, palpitations and leg swelling.  Musculoskeletal: Negative for myalgias and arthralgias.  Skin: Negative for color change and rash.  Neurological: Negative for dizziness, weakness, light-headedness and headaches.  Hematological: Negative for adenopathy.   BP 126/60  Pulse 78  Temp(Src) 98.6 F (37 C) (Oral)  Resp 16  Ht 5\' 6"  (1.676 m)  Wt 165 lb (74.844 kg)  BMI 26.63 kg/m2  SpO2 96%     Objective:   Physical Exam  Constitutional: She is oriented to person, place, and  time. She appears well-developed and well-nourished.  HENT:  Mouth/Throat: Oropharynx is clear and moist.  Eyes: EOM are normal. Pupils are equal, round, and reactive to light. No scleral icterus.  Neck: Normal range of motion. Neck supple. No JVD present. No thyromegaly present.  Cardiovascular: Normal rate, regular rhythm, normal heart sounds and intact distal pulses.   Pulmonary/Chest: Effort normal and breath sounds normal.  Abdominal: Soft. Bowel sounds are normal. She exhibits no mass. There is no tenderness.  Musculoskeletal: Normal range of motion. She exhibits no edema.  Lymphadenopathy:    She has no cervical adenopathy.  Neurological: She is alert and oriented to person, place, and time.  Skin: Skin is warm and dry.          Extensive keloid formation  Psychiatric: She has a normal mood and affect.          Assessment & Plan:  .

## 2010-11-14 ENCOUNTER — Encounter: Payer: Self-pay | Admitting: Internal Medicine

## 2010-11-14 DIAGNOSIS — M8580 Other specified disorders of bone density and structure, unspecified site: Secondary | ICD-10-CM | POA: Insufficient documentation

## 2010-11-14 DIAGNOSIS — M797 Fibromyalgia: Secondary | ICD-10-CM | POA: Insufficient documentation

## 2010-11-14 DIAGNOSIS — Z9889 Other specified postprocedural states: Secondary | ICD-10-CM | POA: Insufficient documentation

## 2010-11-14 NOTE — Assessment & Plan Note (Signed)
She wil return for annual exam but PAP is not  Due unti 2013.

## 2010-11-14 NOTE — Assessment & Plan Note (Signed)
She has a complicated pain syndrome due to her fibromyalgia and keloids and takes a significant amount of oxycodone 3 or 4 times daily.  Will refer to Dr. Barney Drain for plastic surgery and Dr. Kathi Ludwig for rheumatology.

## 2010-11-17 ENCOUNTER — Ambulatory Visit: Payer: Self-pay | Admitting: Internal Medicine

## 2010-11-22 ENCOUNTER — Ambulatory Visit: Payer: Self-pay | Admitting: Pain Medicine

## 2010-11-25 ENCOUNTER — Telehealth: Payer: Self-pay | Admitting: Internal Medicine

## 2010-11-25 NOTE — Telephone Encounter (Signed)
Please explain to patient that I do not routinely call patients back because of my schedule, and that I trust you to convey any important information to me,. I will call a patient back if after discussing with you, you are unable to assist a patient with a problem, or if it is a topic too sensitive to share with staff.    Additionaly,  i do not refer patients to specialist for problems that I have not evaluated during a prior visit because I have to discuss the reason for the referral with the specialist, and "a foot problem" is generally too vague for a referral to a foot doctor.,

## 2010-11-25 NOTE — Telephone Encounter (Signed)
[phone] Pt called  Wanting dr Darrick Huntsman to call her.  Pt stated it was urgent for Dr Darrick Huntsman to call her back Pt would not leave any information regarding what it was about

## 2010-11-25 NOTE — Telephone Encounter (Signed)
Notified patient of message, she stated she completely understands that you are busy and she was sorry for acting that way earlier.  She stated she was just frustrated because she had already left several messages with Zella Ball and no one was returning her call.  She has a follow up with you in December.

## 2010-11-25 NOTE — Telephone Encounter (Signed)
I called patient and she very quickly started getting upset with me stating she has tried 5 different times to get in touch with you.  I told her this is the first time I have got a message on her.  Patient wanted to let you know what her insurance will cover as apart of her healthcare.  She stated they will cover Annual Wellness Screening, Diabetic Screening, Cardiovascular Screening, and Glaucoma Screening.  She also stated she wanted to be referred to a podiatrist, and when I asked what was the reason for the referral she just got very rude and said "for my foot".   Then with the same attitude stated "nevermind I will just get someone else to refer me to a podiatrist".

## 2010-11-25 NOTE — Telephone Encounter (Signed)
This message should have been previously placed before 3:15 pm.  Patient had left voice mail on my phone asking me to return her call.  I did as she requested. When I returned her call she immediately said " She said that she forgot she was in the "[place]' and she wasn't going to get use to our attitudes."She stated she had called 5 times starting on Monday of this week and spoke with Robin.  She stated that when she spoke with Zella Ball on Monday she felt as if Zella Ball didn't believe her about the wrong number being on every sheet of paper that she got from our office.  She then stated she spoke with Morrie Sheldon today after Erie Noe transferred her she stated that Morrie Sheldon had a tone in her voice that she didn't like.  She stated that Morrie Sheldon didn't seem to be listening to her concerns and that all she could report is that this was the first time she had a phone call from patient.  She did inform me that she has already taken papers out on another doctor for not treating her right due to her color.  I asked her to please bring in the papers that had the wrong numbers just so I could get those fixed when she came in next time.  She said that she would bring them in.  I explained to her that I would address the staff with her concerns and I did apologize.

## 2010-12-01 ENCOUNTER — Other Ambulatory Visit: Payer: Self-pay | Admitting: Internal Medicine

## 2010-12-01 ENCOUNTER — Telehealth: Payer: Self-pay | Admitting: Internal Medicine

## 2010-12-01 DIAGNOSIS — M797 Fibromyalgia: Secondary | ICD-10-CM

## 2010-12-01 MED ORDER — OXYCODONE HCL 30 MG PO TABS: 30.0000 mg | ORAL_TABLET | ORAL | Status: DC | PRN

## 2010-12-08 ENCOUNTER — Ambulatory Visit: Payer: Self-pay | Admitting: Rheumatology

## 2010-12-11 ENCOUNTER — Ambulatory Visit: Payer: Self-pay | Admitting: Internal Medicine

## 2010-12-13 ENCOUNTER — Encounter: Payer: Self-pay | Admitting: Internal Medicine

## 2010-12-13 DIAGNOSIS — R7302 Impaired glucose tolerance (oral): Secondary | ICD-10-CM | POA: Insufficient documentation

## 2010-12-13 DIAGNOSIS — C801 Malignant (primary) neoplasm, unspecified: Secondary | ICD-10-CM | POA: Insufficient documentation

## 2010-12-20 ENCOUNTER — Ambulatory Visit: Payer: Self-pay | Admitting: Internal Medicine

## 2010-12-20 ENCOUNTER — Ambulatory Visit (INDEPENDENT_AMBULATORY_CARE_PROVIDER_SITE_OTHER): Payer: Medicare Other | Admitting: Internal Medicine

## 2010-12-20 ENCOUNTER — Encounter: Payer: Self-pay | Admitting: Internal Medicine

## 2010-12-20 VITALS — BP 126/70 | HR 76 | Temp 98.2°F | Resp 16 | Ht 66.0 in | Wt 164.5 lb

## 2010-12-20 DIAGNOSIS — M797 Fibromyalgia: Secondary | ICD-10-CM

## 2010-12-20 DIAGNOSIS — G894 Chronic pain syndrome: Secondary | ICD-10-CM

## 2010-12-20 DIAGNOSIS — IMO0001 Reserved for inherently not codable concepts without codable children: Secondary | ICD-10-CM

## 2010-12-20 MED ORDER — DULOXETINE HCL 30 MG PO CPEP: 30.0000 mg | ORAL_CAPSULE | Freq: Every day | ORAL | Status: DC

## 2010-12-20 NOTE — Patient Instructions (Addendum)
Try a dose of Mylanta for your  Next episodes of abdominal discomfort.    Try a cool washcloth  on your keloid .   We are going to try cymbalta 30 mg daily in the evening for help with your pain and mood.    Check to see wheter you received the TdaP and the Pneumovax vaccines in the last 5 years.

## 2010-12-20 NOTE — Progress Notes (Signed)
  Subjective:    Patient ID: Kim Hopkins, female    DOB: 12-25-1945, 65 y.o.   MRN: [id number]  HPI  Kim Hopkins returns for for follow up on chronic pain secondary to fibromyalgia and extensive keloid formation.  She reports a new symtpmos of burning sensation on her abdomen, which has been present for about one week.  She is scheduled fo see a Engineer, petroleum at [facility], and a rheumatologist,  Dr. Kathi Ludwig .    Past Medical History  Diagnosis Date  . History of colonoscopy 2009    normal, next due 2019  . Fibromyalgia syndrome     managed with oxycodone  . Osteopenia   . allergic rhinitis   . breast cancer 2003    s/p L breast reconst with lat dorsi flap, expander, mastopexy  . S/P cervical spinal fusion 1990  . S/P gastric bypass 2003  . Glucose intolerance (impaired glucose tolerance)     hgba1c 6.16 May 2010   . Bipolar affective disorder   . Keloid skin disorder   . Osteopenia 2008    T score -1.8 L hip  . Pap smear for cervical cancer screening July 2011    Normal    Current Outpatient Prescriptions on File Prior to Visit  Medication Sig Dispense Refill  . calcium citrate-vitamin D (CITRACAL+D) 315-200 MG-UNIT per tablet Take 1 tablet by mouth 2 (two) times daily.        Marland Kitchen exemestane (AROMASIN) 25 MG tablet Take 1 tablet (25 mg total) by mouth daily after breakfast.  30 tablet  11  . oxycodone (ROXICODONE) 30 MG immediate release tablet Take 1 tablet (30 mg total) by mouth every 4 (four) hours as needed.  120 tablet  0   Review of Systems  Constitutional: Negative for fever, chills and unexpected weight change.  HENT: Negative for hearing loss, ear pain, nosebleeds, congestion, sore throat, facial swelling, rhinorrhea, sneezing, mouth sores, trouble swallowing, neck pain, neck stiffness, voice change, postnasal drip, sinus pressure, tinnitus and ear discharge.   Eyes: Negative for pain, discharge, redness and visual disturbance.  Respiratory: Negative for cough, chest tightness,  shortness of breath, wheezing and stridor.   Cardiovascular: Negative for chest pain, palpitations and leg swelling.  Musculoskeletal: Negative for myalgias and arthralgias.  Skin: Negative for color change and rash.  Neurological: Negative for dizziness, weakness, light-headedness and headaches.  Hematological: Negative for adenopathy.       Objective:   Physical Exam  Constitutional: She is oriented to person, place, and time. She appears well-developed and well-nourished.  HENT:  Mouth/Throat: Oropharynx is clear and moist.  Eyes: EOM are normal. Pupils are equal, round, and reactive to light. No scleral icterus.  Neck: Normal range of motion. Neck supple. No JVD present. No thyromegaly present.  Cardiovascular: Normal rate, regular rhythm, normal heart sounds and intact distal pulses.   Pulmonary/Chest: Effort normal and breath sounds normal.  Abdominal: Soft. Bowel sounds are normal. She exhibits no mass. There is no tenderness.  Musculoskeletal: Normal range of motion. She exhibits no edema.  Lymphadenopathy:    She has no cervical adenopathy.  Neurological: She is alert and oriented to person, place, and time.  Skin: Skin is warm and dry.          Extensive keloid formation  Psychiatric: She has a normal mood and affect.          Assessment & Plan:

## 2010-12-20 NOTE — Assessment & Plan Note (Addendum)
She has been referred to Dr. Kathi Ludwig for evaluation  Because I am uncomfortable prescribing narcotics .  We discussed a trial of cymbalta for management of pain and depression.  She has had no prior trial. Samples given.

## 2010-12-23 ENCOUNTER — Telehealth: Payer: Self-pay | Admitting: Internal Medicine

## 2010-12-23 DIAGNOSIS — M797 Fibromyalgia: Secondary | ICD-10-CM

## 2010-12-23 MED ORDER — LACTULOSE 20 GM/30ML PO SOLN: 30.0000 mL | Freq: Four times a day (QID) | ORAL | Status: DC | PRN

## 2010-12-23 NOTE — Telephone Encounter (Signed)
Patient called and stated she had talked to you about an issue she is having at her office visit with her bowels.  She stated you advised she could take Mylanta and she has been taking a similar version that the pharmacist recommend of that because Mylanta has a recall on it right now.    She stated she has not been able to eat since Sunday or have a BM at all.  She has been drinking water though.   She stated she just does not have an appetite.  She wanted to know what you suggest.  Please advise.

## 2010-12-23 NOTE — Telephone Encounter (Signed)
The mylatna was for burnign,  It does not treat constipation.  Mild of magnesia treats constipation.  If she would like something stronger,  Please call her in Lactulose  I will update chart,.  It is a liquid cathartic laxative and can be taken 30 ml every 6 hours until her constipation is relieved. (usually takes no more than 2 doses).  Should not use more than once a week.   Once her constipation is relieved I recommend daily use of miralax, available OTC

## 2010-12-24 ENCOUNTER — Ambulatory Visit: Payer: Self-pay | Admitting: Internal Medicine

## 2010-12-24 NOTE — Telephone Encounter (Signed)
Left message asking patient to call me back

## 2010-12-27 ENCOUNTER — Ambulatory Visit: Payer: Self-pay | Admitting: Internal Medicine

## 2010-12-28 ENCOUNTER — Telehealth: Payer: Self-pay | Admitting: Internal Medicine

## 2010-12-28 ENCOUNTER — Ambulatory Visit: Payer: Self-pay | Admitting: Internal Medicine

## 2010-12-28 MED ORDER — OXYCODONE HCL 30 MG PO TABS: 30.0000 mg | ORAL_TABLET | ORAL | Status: DC | PRN

## 2010-12-28 NOTE — Telephone Encounter (Signed)
Patient called back and was a little upset about not hearing back from [place]. I explained to her that we did leave a message for her to call the office on Friday. She just wanted you to know that on Friday she went to [facility] in [place] on Friday and they did an xray and told her that she had stool in her stomach and then sent her for a ct yesterday and from that she was told she has a virus and to just drink plenty of fluid and give it time to run its course. She told me that she is doing better today, not having as much abdominal pain and has been able to eat a little today. She says that she is going to go out of town and will call us when she returns if not feeling any better.

## 2010-12-28 NOTE — Telephone Encounter (Signed)
Pt called to get refill on oxycodone [facility] Please call pt when rx is refill Please call pt ashley w was going to call her back  Pt went to [facility]

## 2010-12-28 NOTE — Telephone Encounter (Signed)
Please see phone note from the 13th.

## 2010-12-28 NOTE — Telephone Encounter (Signed)
The "stool in her stomach" comment means that she was very , very constipated  She needs to take a stimulant laxative like Ex lax or dulcolax or Sennakot s daily because she is on high doses of narcotics (oxycodone) which can cause constipaiton. The rx for oxycodoen has been printed.  She can come pick it up tomorrow.

## 2010-12-28 NOTE — Telephone Encounter (Signed)
Patient is also asking for rx for her oxycodone. Please give rx to me after it is signed and I will call patient and let her know that it is ready.

## 2010-12-29 ENCOUNTER — Other Ambulatory Visit: Payer: Self-pay | Admitting: *Deleted

## 2010-12-29 NOTE — Telephone Encounter (Signed)
Patient advised via telephone, Rx is ready for pick up will be left at front desk.

## 2011-01-11 ENCOUNTER — Ambulatory Visit: Payer: Self-pay | Admitting: Internal Medicine

## 2011-01-13 NOTE — Telephone Encounter (Signed)
Patient returned my call today. She says that she is now feeling better. She was very thankful that I called to check on her.

## 2011-01-13 NOTE — Telephone Encounter (Signed)
I have tried calling this patient numerous times and always goes straight to voice mail. Today I called and got her voicemail again, left another message for her call us.

## 2011-01-17 ENCOUNTER — Other Ambulatory Visit: Payer: Self-pay | Admitting: *Deleted

## 2011-01-17 DIAGNOSIS — M797 Fibromyalgia: Secondary | ICD-10-CM

## 2011-01-17 NOTE — Telephone Encounter (Signed)
Pt is asking for refill, looks like her last refill was picked up around 12/19.  I called pt to verify, but got her voice mail.

## 2011-01-17 NOTE — Telephone Encounter (Signed)
Patient returned call. She says that the rx that was written was only for a 20 day supply. She says that she is due now.

## 2011-01-18 MED ORDER — OXYCODONE HCL 30 MG PO TABS: 30.0000 mg | ORAL_TABLET | ORAL | Status: DC | PRN

## 2011-01-18 NOTE — Telephone Encounter (Signed)
120/month is 4 a day.  Which is what my notes say she is taking ,  Along with 30 mg of oxycontin twice daily.  Please confirm how many she is taking daily and ask her if Dr. Kathi Ludwig the rheumatologist has writeen her any prescriptison for either medication.

## 2011-01-18 NOTE — Telephone Encounter (Signed)
Advised pt, script placed up front for her to pick up.

## 2011-01-18 NOTE — Telephone Encounter (Signed)
Please find out from pharmacy when last refill on the oxycontin 30 mg rx was filled.

## 2011-01-18 NOTE — Telephone Encounter (Signed)
Spoke with patient.  She says she usually takes one oxycodone every 4-6 hours, 4-5 a day.  She says she doesn't take oxycontin, which pharmacist verified.  She says she has not gotten any oxycodone from her rheumatologist, only cymbalta, but she says her rheumatologist has told her that she will prescribe the oxycodone for her if you want her to take over.

## 2011-01-18 NOTE — Telephone Encounter (Signed)
Yes I would prefer that Dr Kathi Ludwig take over refil the narcotics.  I will refill the oxycodone this time so she is not without it.

## 2011-01-19 ENCOUNTER — Telehealth: Payer: Self-pay | Admitting: *Deleted

## 2011-01-19 NOTE — Telephone Encounter (Signed)
Generic exemestane is ok  Has she been set up with oncology yet for followup?

## 2011-01-19 NOTE — Telephone Encounter (Signed)
Pt's insurance company has faxed a letter saying that aromasin will no longer be covered, they want her to use generic exemestane.  Do you want to change, if not, I will call for prior auth form.

## 2011-01-20 NOTE — Telephone Encounter (Signed)
Advised pt.  She will discuss this with her oncologist.

## 2011-03-18 ENCOUNTER — Telehealth: Payer: Self-pay | Admitting: Internal Medicine

## 2011-03-18 NOTE — Telephone Encounter (Signed)
Patient called and stated she noticed last month was Heart Disease awareness month.  She wanted to know if there is any test she can do as a preventive to see if she has any heart disease.  She stated she wants to be proactive in finding anything wrong with her, if there is any.  Please advise.

## 2011-03-21 NOTE — Telephone Encounter (Signed)
Typiically we start with screening for cholesterol and diabetes,  She can come in for fasting lipids , CMET TSH and HgbA1c followd by an office visit.

## 2011-03-22 NOTE — Telephone Encounter (Signed)
Patient notified, she will call back and make an appt.

## 2011-03-24 ENCOUNTER — Other Ambulatory Visit (INDEPENDENT_AMBULATORY_CARE_PROVIDER_SITE_OTHER): Payer: Medicare Other | Admitting: *Deleted

## 2011-03-24 DIAGNOSIS — R7302 Impaired glucose tolerance (oral): Secondary | ICD-10-CM

## 2011-03-24 DIAGNOSIS — Z0000 Encounter for general adult medical examination without abnormal findings: Secondary | ICD-10-CM

## 2011-03-24 DIAGNOSIS — Z1329 Encounter for screening for other suspected endocrine disorder: Secondary | ICD-10-CM

## 2011-03-24 DIAGNOSIS — R5383 Other fatigue: Secondary | ICD-10-CM

## 2011-03-24 DIAGNOSIS — Z13220 Encounter for screening for lipoid disorders: Secondary | ICD-10-CM

## 2011-03-24 DIAGNOSIS — Z131 Encounter for screening for diabetes mellitus: Secondary | ICD-10-CM

## 2011-03-24 DIAGNOSIS — R7309 Other abnormal glucose: Secondary | ICD-10-CM

## 2011-03-24 DIAGNOSIS — R5381 Other malaise: Secondary | ICD-10-CM

## 2011-03-24 LAB — COMPREHENSIVE METABOLIC PANEL
ALT: 9 U/L (ref 0–35)
Albumin: 4.1 g/dL (ref 3.5–5.2)
CO2: 29 mEq/L (ref 19–32)
Calcium: 9.3 mg/dL (ref 8.4–10.5)
Chloride: 106 mEq/L (ref 96–112)
GFR: 87.67 mL/min (ref >60.00)
Potassium: 3.9 mEq/L (ref 3.5–5.1)
Sodium: 141 mEq/L (ref 135–145)
Total Protein: 7.6 g/dL (ref 6.0–8.3)

## 2011-03-25 LAB — LIPID PANEL
LDL Cholesterol: 75 mg/dL (ref 0–99)
Total CHOL/HDL Ratio: 2
Triglycerides: 69 mg/dL (ref 0.0–149.0)

## 2011-03-30 ENCOUNTER — Telehealth: Payer: Self-pay | Admitting: Internal Medicine

## 2011-03-30 DIAGNOSIS — M797 Fibromyalgia: Secondary | ICD-10-CM

## 2011-03-30 MED ORDER — OXYCODONE HCL 30 MG PO TABS: 30.0000 mg | ORAL_TABLET | ORAL | Status: DC | PRN

## 2011-03-30 NOTE — Telephone Encounter (Signed)
11 day supply authorized .  rx on printer needs signing.

## 2011-03-30 NOTE — Telephone Encounter (Signed)
[phone] EXT 251 ASHLEY @ DR sYED OFFICE CALLED EHE IS OUT OF COUNTRY TILL 04/11/11 CAN YOU REFILL MS FORDS  OXYCONDONE 30MG  UNTIL SHE COME BACK IN THE OFFICE 11 DAY SUPPLY

## 2011-03-31 NOTE — Telephone Encounter (Signed)
Kim Hopkins at Dr. Kathi Ludwig office notified of Rx and patient notified as well.  Patient will pick up Rx.

## 2011-04-04 ENCOUNTER — Encounter: Payer: Self-pay | Admitting: Internal Medicine

## 2011-04-04 ENCOUNTER — Ambulatory Visit (INDEPENDENT_AMBULATORY_CARE_PROVIDER_SITE_OTHER): Payer: Medicare Other | Admitting: Internal Medicine

## 2011-04-04 DIAGNOSIS — R7302 Impaired glucose tolerance (oral): Secondary | ICD-10-CM

## 2011-04-04 DIAGNOSIS — R7309 Other abnormal glucose: Secondary | ICD-10-CM

## 2011-04-04 DIAGNOSIS — E119 Type 2 diabetes mellitus without complications: Secondary | ICD-10-CM

## 2011-04-04 NOTE — Assessment & Plan Note (Signed)
Rechecked an 6.5.  Given her FH of DM,  I brought her in today to disucss dietary changes which will hopfeully prevent her from progressing to needing medications.  Has not had a diagnostic fasting glucose. .  Low glyceminc index diet discussed.

## 2011-04-04 NOTE — Patient Instructions (Addendum)
Type 2 Diabetes Diabetes is a long-lasting (chronic) disease. One or both of the following happen with type 2 diabetes:    The pancreas does not make enough of a hormone called insulin.   The body has trouble using the insulin that is made.  HOME CARE  Check your blood sugar (glucose) once a day, or as told by your doctor.   Take all medicine as told by your doctor.   Do not smoke.   Eat healthy foods. Weight loss can help your diabetes.   Learn about low blood sugar (hypoglycemia). Know how to treat it.   Get your eyes checked on a regular basis.   Get a physical exam every year. Get your blood pressure checked. Get your blood and pee (urine) tested.   Wear a necklace or bracelet that says you have diabetes.   Check your feet every night for cuts, sores, blisters, and redness. Tell your doctor if you have problems.  GET HELP RIGHT AWAY IF:  You have trouble keeping your blood sugar in target range.   You have problems with your medicines.   You are sick and not getting better after 24 hours.   You have a sore or wound that is not healing.   You have vision problems or changes.   You have a fever.  MAKE SURE YOU:  Understand these instructions.   Will watch your condition.   Will get help right away if you are not doing well or get worse.  Document Released: 10/06/2007 Document Revised: 12/16/2010 Document Reviewed: 06/14/2010 ExitCare Patient Information 2012 ExitCare, [place].  ____________________________________________________  Consider these Low Glycemic Index Diet substitutions to maintain good blood sugars,  Weight control , etc   For breakfast:  Instead of cereal and banana,  Choose eggs/breakfast meat wrapped in a low carb pita bread or low carb tortilla  (or substitute a toasted sandwhich thin w/ peanut butter)  Try pita bread and hummous with  pimiento cheese dip instead of cornbread or Hush puppies.    Lunch: sandwich on pita bread or flatbread  (Joseph's makes a pita bread and a flat bread , available at [employer] and [employer]; Toufayah makes a low carb flatbread available at [employer] and [employer])   And [employer] and [employer]  Make a low carb whole wheat tortila   3 PM:  Mid day snacks   Try a  cheese stick, or snack on nuts:  almonds, walnuts, pistachios, pecans, peanuts,  Macadamia nuts  6 PM  Dinner:  "mean and green:"  Meat/chicken/fish/lpinto beans   With a green salad and  A green veggie : use ranch, vinagrette,  Blue cheese, etc  Avoid "lite, fat frer" dressings  and thousand island/French dressings (contain sugar)  9 PM snack : Breyer's low carb fudgiscle or  ice cream bar (the "Carb Smart" line )  Or  Weight Watcher's  And Skinny Cow  ice cream treats  Try the Atkins snack size protein bars available at [employer],  [employer],  [employer], and all major grocery stores.  They are full of protein, low in carbs and  high in fiber

## 2011-04-04 NOTE — Progress Notes (Signed)
Patient ID: Kim Hopkins, female   DOB: 18-Dec-1945, 66 y.o.   MRN: [id number]  Patient Active Problem List  Diagnoses  . Cervical cancer screening  . History of gastric bypass  . Keloid skin disorder  . History of colonoscopy  . Fibromyalgia syndrome  . Osteopenia  . breast cancer  . Glucose intolerance (impaired glucose tolerance)    Subjective:  CC:   Chief Complaint  Patient presents with  . Diabetes    discuss new onset    HPI:   Kim Fordis a 66 y.o. female who presents  Past Medical History  Diagnosis Date  . History of colonoscopy 2009    normal, next due 2019  . Fibromyalgia syndrome     managed with oxycodone  . Osteopenia   . allergic rhinitis   . breast cancer 2003    s/p L breast reconst with lat dorsi flap, expander, mastopexy  . S/P cervical spinal fusion 1990  . S/P gastric bypass 2003  . Glucose intolerance (impaired glucose tolerance)     hgba1c 6.16 May 2010   . Bipolar affective disorder   . Keloid skin disorder   . Osteopenia 2008    T score -1.8 L hip  . Pap smear for cervical cancer screening July 2011    Normal    Past Surgical History  Procedure Date  . Mastectomy, radical   . Gastric bypass   . Cesarean section   . Reconstruction breast w/ latissimus dorsi flap          The following portions of the patient's history were reviewed and updated as appropriate: Allergies, current medications, and problem list.    Review of Systems:   12 Pt  review of systems was negative except those addressed in the HPI,     History   Social History  . Marital Status: Divorced    Spouse Name: N/A    Number of Children: N/A  . Years of Education: N/A   Occupational History  . Not on file.   Social History Main Topics  . Smoking status: Never Smoker   . Smokeless tobacco: Never Used  . Alcohol Use: No  . Drug Use: No  . Sexually Active: Not on file   Other Topics Concern  . Not on file   Social History Narrative  .  No narrative on file    Objective:  BP 144/68  Pulse 94  Temp(Src) 97.9 F (36.6 C) (Oral)  Resp 16  Ht 5\' 6"  (1.676 m)  Wt 164 lb (74.39 kg)  BMI 26.47 kg/m2  SpO2 100%  General appearance: alert, cooperative and appears stated age Ears: normal TM's and external ear canals both ears Throat: lips, mucosa, and tongue normal; teeth and gums normal Neck: no adenopathy, no carotid bruit, supple, symmetrical, trachea midline and thyroid not enlarged, symmetric, no tenderness/mass/nodules Back: symmetric, no curvature. ROM normal. No CVA tenderness. Lungs: clear to auscultation bilaterally Heart: regular rate and rhythm, S1, S2 normal, no murmur, click, rub or gallop Abdomen: soft, non-tender; bowel sounds normal; no masses,  no organomegaly Pulses: 2+ and symmetric Skin: Skin color, texture, turgor normal. No rashes or lesions Lymph nodes: Cervical, supraclavicular, and axillary nodes normal.  Assessment and Plan:  Glucose intolerance (impaired glucose tolerance) HgbA1c was rechecked recently and still elevated at   6.5.  Given her FH of DM,  I brought her in today to discuss dietary changes which will hopefully prevent her from progressing to needing medications.  Has not  had a diagnostic fasting glucose. .  Low glyceminc index diet discussed.  Return in 3 months for urinary microalb/cr ratio, repeat CMET and A1c.  Referral for diabetic eye exam made     Updated Medication List Outpatient Encounter Prescriptions as of 04/04/2011  Medication Sig Dispense Refill  . calcium citrate-vitamin D (CITRACAL+D) 315-200 MG-UNIT per tablet Take 1 tablet by mouth 2 (two) times daily.        . DULoxetine (CYMBALTA) 30 MG capsule Take 1 capsule (30 mg total) by mouth daily.  30 capsule  2  . exemestane (AROMASIN) 25 MG tablet Take 1 tablet (25 mg total) by mouth daily after breakfast.  30 tablet  11  . oxycodone (ROXICODONE) 30 MG immediate release tablet Take 1 tablet (30 mg total) by mouth  every 4 (four) hours as needed.  35 tablet  0  . DISCONTD: Lactulose 20 GM/30ML SOLN Take 30 mLs (20 g total) by mouth every 6 (six) hours as needed (constipation ).  120 mL  2     Orders Placed This Encounter  Procedures  . COMPLETE METABOLIC PANEL WITH GFR  . Hemoglobin A1c  . Microalbumin / creatinine urine ratio  . Ambulatory referral to Ophthalmology    Return in about 3 months (around 07/05/2011).

## 2011-04-21 ENCOUNTER — Ambulatory Visit: Payer: Self-pay | Admitting: Oncology

## 2011-04-29 LAB — HM DIABETES EYE EXAM: HM Diabetic Eye Exam: NORMAL

## 2011-05-13 ENCOUNTER — Ambulatory Visit: Payer: Self-pay | Admitting: Oncology

## 2011-05-18 LAB — CBC CANCER CENTER
Eosinophil #: 0.1 x10 3/mm (ref 0.0–0.7)
Eosinophil %: 2.1 %
HCT: 35.1 % (ref 35.0–47.0)
HGB: 11.4 g/dL — ABNORMAL LOW (ref 12.0–16.0)
Lymphocyte #: 1.4 x10 3/mm (ref 1.0–3.6)
Lymphocyte %: 40.3 %
MCH: 27.8 pg (ref 26.0–34.0)
MCV: 86 fL (ref 80–100)
Monocyte %: 5.4 %
Neutrophil #: 1.8 x10 3/mm (ref 1.4–6.5)
Neutrophil %: 51 %
Platelet: 163 x10 3/mm (ref 150–440)
RBC: 4.08 10*6/uL (ref 3.80–5.20)
RDW: 16.2 % — ABNORMAL HIGH (ref 11.5–14.5)
WBC: 3.5 x10 3/mm — ABNORMAL LOW (ref 3.6–11.0)

## 2011-05-18 LAB — GLUCOSE, RANDOM: Glucose: 113 mg/dL — ABNORMAL HIGH (ref 65–99)

## 2011-06-11 ENCOUNTER — Ambulatory Visit: Payer: Self-pay | Admitting: Oncology

## 2011-07-01 ENCOUNTER — Ambulatory Visit (INDEPENDENT_AMBULATORY_CARE_PROVIDER_SITE_OTHER): Payer: Medicare Other | Admitting: Internal Medicine

## 2011-07-01 ENCOUNTER — Encounter: Payer: Self-pay | Admitting: Internal Medicine

## 2011-07-01 VITALS — BP 130/64 | HR 70 | Temp 98.0°F | Resp 14 | Wt 164.5 lb

## 2011-07-01 DIAGNOSIS — L910 Hypertrophic scar: Secondary | ICD-10-CM

## 2011-07-01 DIAGNOSIS — IMO0001 Reserved for inherently not codable concepts without codable children: Secondary | ICD-10-CM

## 2011-07-01 DIAGNOSIS — E119 Type 2 diabetes mellitus without complications: Secondary | ICD-10-CM

## 2011-07-01 DIAGNOSIS — F19239 Other psychoactive substance dependence with withdrawal, unspecified: Secondary | ICD-10-CM

## 2011-07-01 DIAGNOSIS — M797 Fibromyalgia: Secondary | ICD-10-CM

## 2011-07-01 DIAGNOSIS — F19939 Other psychoactive substance use, unspecified with withdrawal, unspecified: Secondary | ICD-10-CM

## 2011-07-01 MED ORDER — PREGABALIN 150 MG PO CAPS: 150.0000 mg | ORAL_CAPSULE | Freq: Two times a day (BID) | ORAL | Status: DC

## 2011-07-01 MED ORDER — OXYCODONE HCL 30 MG PO TABS: 30.0000 mg | ORAL_TABLET | ORAL | Status: DC | PRN

## 2011-07-01 NOTE — Patient Instructions (Signed)
I have refilled your oxycodone for two weeks.  I am increasing your Lyrica to 150 mg twice daily    Please try to see Dr Kathi Ludwig in the next 2 weeks

## 2011-07-01 NOTE — Progress Notes (Signed)
Patient ID: Kim Hopkins, female   DOB: 07-17-1945, 66 y.o.   MRN: [id number]  Patient Active Problem List  Diagnosis  . Cervical cancer screening  . History of gastric bypass  . Keloid skin disorder  . History of colonoscopy  . Fibromyalgia syndrome  . Osteopenia  . breast cancer  . Withdrawal symptoms, drug or narcotic    Subjective:  CC:   Chief Complaint  Patient presents with  . Rash  . Medication Refill    oxycodone, can't see other doctor until next week  . Depression    HPI:   Kim Hopkins a 66 y.o. female who presents Generally feeling bad.  She reports feeling anxious and depressed since starting Lyrica,prescribed by Dr. Kathi Ludwig for FM.   Continues to have persistent moderate to severe  pain, due to fibromyalgia and keloid/scarring. Pain is characterized as a burning sensation.  Her pain has been managed with moderately high doses of narcotics prescribed by Dr. Kathi Ludwig.   She has run out early and Dr Kathi Ludwig has sent her to me.  Having hot flashes at night due to continued use of aromasin post mastectomy for BRCA. She is having pruritus at night., scratching herself due to uncontrolled itching for the past week.  Last dose of oxycodone was one week ago.  She did not run out early; her prescriber had gone out of town and she had no access to refills.     Past Medical History  Diagnosis Date  . History of colonoscopy 2009    normal, next due 2019  . Fibromyalgia syndrome     managed with oxycodone  . Osteopenia   . allergic rhinitis   . breast cancer 2003    s/p L breast reconst with lat dorsi flap, expander, mastopexy  . S/P cervical spinal fusion 1990  . S/P gastric bypass 2003  . Glucose intolerance (impaired glucose tolerance)     hgba1c 6.16 May 2010   . Bipolar affective disorder   . Keloid skin disorder   . Osteopenia 2008    T score -1.8 L hip  . Pap smear for cervical cancer screening July 2011    Normal    Past Surgical History  Procedure Date  .  Mastectomy, radical   . Gastric bypass   . Cesarean section   . Reconstruction breast w/ latissimus dorsi flap      The following portions of the patient's history were reviewed and updated as appropriate: Allergies, current medications, and problem list.   Review of Systems:   A comprehensive review of systems was negative except those addressed in the HPI,     History   Social History  . Marital Status: Divorced    Spouse Name: N/A    Number of Children: N/A  . Years of Education: N/A   Occupational History  . Not on file.   Social History Main Topics  . Smoking status: Never Smoker   . Smokeless tobacco: Never Used  . Alcohol Use: No  . Drug Use: No  . Sexually Active: Not on file   Other Topics Concern  . Not on file   Social History Narrative  . No narrative on file    Objective:  BP 130/64  Pulse 70  Temp 98 F (36.7 C) (Oral)  Resp 14  Wt 164 lb 8 oz (74.617 kg)  SpO2 99%  General appearance: alert, cooperative and appears stated age.  She appears to be in pain.. Neck: no adenopathy, no carotid  bruit, supple, symmetrical, trachea midline and thyroid not enlarged, symmetric, no tenderness/mass/nodules Back: symmetric, no curvature. ROM normal. No CVA tenderness. Lungs: clear to auscultation bilaterally Heart: regular rate and rhythm, S1, S2 normal, no murmur, click, rub or gallop Abdomen: soft, non-tender; bowel sounds normal; no masses,  no organomegaly Pulses: 2+ and symmetric Skin: Skin color, texture, turgor normal. No rashes or lesions. Some excoriations noted.  Lymph nodes: Cervical, supraclavicular, and axillary nodes normal.  Assessment and Plan:  Withdrawal symptoms, drug or narcotic Secondary to chronic narcotic use for management of keloid pain and FM.  2 week refill given until Dr. Kathi Ludwig returns.   Keloid skin disorder She has  Been referred to a specialist at [facility] who has discussed and planned keloid removal surgery in the near  future.    Updated Medication List Outpatient Encounter Prescriptions as of 07/01/2011  Medication Sig Dispense Refill  . calcium citrate-vitamin D (CITRACAL+D) 315-200 MG-UNIT per tablet Take 1 tablet by mouth 2 (two) times daily.        Marland Kitchen exemestane (AROMASIN) 25 MG tablet Take 1 tablet (25 mg total) by mouth daily after breakfast.  30 tablet  11  . fish oil-omega-3 fatty acids 1000 MG capsule Take 2 g by mouth daily.      Marland Kitchen oxycodone (ROXICODONE) 30 MG immediate release tablet Take 1 tablet (30 mg total) by mouth every 4 (four) hours as needed.  70 tablet  0  . pregabalin (LYRICA) 150 MG capsule Take 1 capsule (150 mg total) by mouth 2 (two) times daily.  60 capsule  1  . DISCONTD: oxycodone (ROXICODONE) 30 MG immediate release tablet Take 1 tablet (30 mg total) by mouth every 4 (four) hours as needed.  35 tablet  0  . DISCONTD: Pregabalin (LYRICA PO) Take by mouth.      . DISCONTD: DULoxetine (CYMBALTA) 30 MG capsule Take 1 capsule (30 mg total) by mouth daily.  30 capsule  2     No orders of the defined types were placed in this encounter.    No Follow-up on file.

## 2011-07-03 DIAGNOSIS — F19239 Other psychoactive substance dependence with withdrawal, unspecified: Secondary | ICD-10-CM | POA: Insufficient documentation

## 2011-07-03 DIAGNOSIS — F19939 Other psychoactive substance use, unspecified with withdrawal, unspecified: Secondary | ICD-10-CM | POA: Insufficient documentation

## 2011-07-03 NOTE — Assessment & Plan Note (Addendum)
Secondary to chronic narcotic use for management of keloid pain and FM.  2 week refill given until Dr. Kathi Ludwig returns.

## 2011-07-03 NOTE — Assessment & Plan Note (Signed)
She has  Been referred to a specialist at [facility] who has discussed and planned keloid removal surgery in the near future.

## 2011-07-11 ENCOUNTER — Ambulatory Visit: Payer: Self-pay | Admitting: Oncology

## 2011-07-29 ENCOUNTER — Other Ambulatory Visit: Payer: Self-pay | Admitting: Internal Medicine

## 2011-07-29 DIAGNOSIS — M797 Fibromyalgia: Secondary | ICD-10-CM

## 2011-07-29 DIAGNOSIS — G894 Chronic pain syndrome: Secondary | ICD-10-CM

## 2011-07-29 MED ORDER — OXYCODONE HCL 30 MG PO TABS: 30.0000 mg | ORAL_TABLET | ORAL | Status: DC | PRN

## 2011-07-29 NOTE — Telephone Encounter (Signed)
Orders in EPIC.  She sees a specialist at [facility], so you may want the rheumatology and Pain Clinic referrals to start there  If no appts in [facility]

## 2011-07-29 NOTE — Telephone Encounter (Signed)
Patient needing refills on her medication Oxycodone 30 mg and Lyrica 200 mg because Dr. Kathi Ludwig is leaving. She is also needing referrals for pain management ,rheumatologist.

## 2011-08-03 ENCOUNTER — Telehealth: Payer: Self-pay | Admitting: Internal Medicine

## 2011-08-03 NOTE — Telephone Encounter (Signed)
I will be glad to call the patient.  Thanks

## 2011-08-03 NOTE — Telephone Encounter (Signed)
Dr Lavenia Atlas does not see patients for fibromyalgia and Dr Sherryll Burger is leaving the practice and not taking any more appointments.  Who else would you like me to try and schedule with for this patient

## 2011-08-03 NOTE — Telephone Encounter (Signed)
I believe she recently saw a Pain specialist at [facility] or [facility] so it would make sense to start with the same center. Can you call patient to confirm which university or do you want my CMA to do it?

## 2011-08-09 ENCOUNTER — Telehealth: Payer: Self-pay | Admitting: Internal Medicine

## 2011-08-09 DIAGNOSIS — M797 Fibromyalgia: Secondary | ICD-10-CM

## 2011-08-09 MED ORDER — OXYCODONE HCL 30 MG PO TABS: 30.0000 mg | ORAL_TABLET | ORAL | Status: DC | PRN

## 2011-08-09 NOTE — Telephone Encounter (Signed)
Patient has an appoinment with Dr. Radonna Ricker at [facility] on 9.17.13 @ 9:00 . The patient is requesting Dr. Darrick Huntsman refill her Oxycodone 30 mg for a month until she can see the rheumatologist in September.

## 2011-08-09 NOTE — Telephone Encounter (Signed)
Patient notified, Rx ready in folder in front office.

## 2011-08-09 NOTE — Telephone Encounter (Signed)
No problem,  rx on printer

## 2011-08-10 ENCOUNTER — Other Ambulatory Visit: Payer: Self-pay | Admitting: Internal Medicine

## 2011-08-10 DIAGNOSIS — M797 Fibromyalgia: Secondary | ICD-10-CM

## 2011-08-10 MED ORDER — OXYCODONE HCL 30 MG PO TABS: 30.0000 mg | ORAL_TABLET | ORAL | Status: DC | PRN

## 2011-08-16 NOTE — Telephone Encounter (Signed)
DONE

## 2011-08-22 ENCOUNTER — Ambulatory Visit: Payer: Self-pay | Admitting: Oncology

## 2011-08-26 ENCOUNTER — Other Ambulatory Visit: Payer: Self-pay | Admitting: Internal Medicine

## 2011-08-26 DIAGNOSIS — M797 Fibromyalgia: Secondary | ICD-10-CM

## 2011-08-26 MED ORDER — OXYCODONE HCL 30 MG PO TABS: 30.0000 mg | ORAL_TABLET | ORAL | Status: DC | PRN

## 2011-08-26 MED ORDER — PREGABALIN 150 MG PO CAPS: 150.0000 mg | ORAL_CAPSULE | Freq: Two times a day (BID) | ORAL | Status: DC

## 2011-08-26 NOTE — Telephone Encounter (Signed)
Needing prescriptions for Lyrica 75 mg takes 3 times a day and Oxycodone 30 mg.Patient is going out of town for two weeks and wants to leave these prescriptions at the pharmacy before she leaves ,because [facility] will not fill an out of town script but they can look in the computer and refill it if its time. She informed me that this will be her last time asking for these scripts . The rheumatologist will fill after she is seen in his office.

## 2011-08-30 ENCOUNTER — Ambulatory Visit: Payer: Self-pay

## 2011-09-01 ENCOUNTER — Telehealth: Payer: Self-pay | Admitting: Internal Medicine

## 2011-09-01 DIAGNOSIS — M797 Fibromyalgia: Secondary | ICD-10-CM

## 2011-09-01 NOTE — Telephone Encounter (Signed)
Patient needing her prescription changed form 120 tablets to 180 tablets to last her month . [facility] inform [facility] of the change.

## 2011-09-02 ENCOUNTER — Telehealth: Payer: Self-pay | Admitting: Internal Medicine

## 2011-09-02 MED ORDER — OXYCODONE HCL 30 MG PO TABS: 30.0000 mg | ORAL_TABLET | ORAL | Status: DC | PRN

## 2011-09-02 NOTE — Telephone Encounter (Signed)
error 

## 2011-09-02 NOTE — Telephone Encounter (Signed)
Patient notified, she was fine with this.

## 2011-09-02 NOTE — Telephone Encounter (Signed)
I have reviewed the notes from Dr. Kathi Ludwig ,  Who was filling her prescriptions up until July.  She was only allowing her to have 3 per day.  I am not willing to go above 4 per day.  She should do as Dr. Kathi Ludwig recommended, which is take ibuprofen or tylenol in between .

## 2011-09-02 NOTE — Telephone Encounter (Signed)
Patient stated she is only getting a 20 day supply of the oxycodone because she is only getting 120 tablets.  She stated the Rx says she can take one tablet every 4 hours as needed.  Patient stated she is trying to make it every 5 hours but sometimes she can't.  I advised her that every time we have refilled we have only given her 120 tablets.  Please advise.

## 2011-09-08 ENCOUNTER — Telehealth: Payer: Self-pay | Admitting: Internal Medicine

## 2011-09-08 NOTE — Telephone Encounter (Signed)
Patient called requesting we refill her oxycodone early, it was supposed to be refilled again on August 31st.  The pharmacy has had the Rx on hold since August 19th.  I spoke with Dr. Darrick Huntsman and she authorized the early refill because patient has been out.  Pharmacy and patient notified.

## 2011-09-11 ENCOUNTER — Ambulatory Visit: Payer: Self-pay | Admitting: Oncology

## 2011-09-29 ENCOUNTER — Ambulatory Visit: Payer: Self-pay | Admitting: Pain Medicine

## 2011-10-04 ENCOUNTER — Ambulatory Visit: Payer: Medicare Other

## 2011-10-04 ENCOUNTER — Telehealth: Payer: Self-pay | Admitting: Internal Medicine

## 2011-10-04 DIAGNOSIS — M797 Fibromyalgia: Secondary | ICD-10-CM

## 2011-10-04 NOTE — Telephone Encounter (Signed)
Pt is wanting a call back if possible.  Best callback number [phone]

## 2011-10-05 MED ORDER — OXYCODONE HCL 30 MG PO TABS: 30.0000 mg | ORAL_TABLET | ORAL | Status: DC | PRN

## 2011-10-05 NOTE — Telephone Encounter (Signed)
REFILL PRINTED. #[phone]

## 2011-10-05 NOTE — Telephone Encounter (Signed)
Patient will pick up tomorrow.

## 2011-10-05 NOTE — Telephone Encounter (Signed)
I called patient and she stated Dr. Metta Clines wants you to refill her Oxycodone for one more month and then he will take over.  She stated she is due for another refill tomorrow and would like to pick it up when she comes for her Flu shot.  Please advise.

## 2011-10-06 ENCOUNTER — Other Ambulatory Visit: Payer: Self-pay | Admitting: Internal Medicine

## 2011-10-06 ENCOUNTER — Ambulatory Visit (INDEPENDENT_AMBULATORY_CARE_PROVIDER_SITE_OTHER): Payer: Medicare Other | Admitting: Internal Medicine

## 2011-10-06 DIAGNOSIS — M797 Fibromyalgia: Secondary | ICD-10-CM

## 2011-10-06 DIAGNOSIS — Z23 Encounter for immunization: Secondary | ICD-10-CM

## 2011-10-06 MED ORDER — OXYCODONE HCL 30 MG PO TABS: 30.0000 mg | ORAL_TABLET | ORAL | Status: DC | PRN

## 2011-10-19 ENCOUNTER — Ambulatory Visit (HOSPITAL_BASED_OUTPATIENT_CLINIC_OR_DEPARTMENT_OTHER): Payer: Self-pay

## 2011-10-19 ENCOUNTER — Other Ambulatory Visit (HOSPITAL_BASED_OUTPATIENT_CLINIC_OR_DEPARTMENT_OTHER): Payer: Self-pay

## 2011-10-19 ENCOUNTER — Encounter (HOSPITAL_BASED_OUTPATIENT_CLINIC_OR_DEPARTMENT_OTHER): Payer: Medicare Other | Admitting: Nurse Practitioner

## 2011-10-19 LAB — LIPID PANEL

## 2011-10-19 LAB — CBC, DIFF

## 2011-10-19 LAB — COMP METABOLIC PANEL

## 2011-10-19 LAB — VITAMIN D (25 HYDROXY)

## 2011-10-24 ENCOUNTER — Ambulatory Visit: Payer: Self-pay | Admitting: Oncology

## 2011-10-27 ENCOUNTER — Ambulatory Visit: Payer: Self-pay | Admitting: Pain Medicine

## 2011-11-24 ENCOUNTER — Ambulatory Visit: Payer: Self-pay | Admitting: Pain Medicine

## 2011-12-22 ENCOUNTER — Ambulatory Visit: Payer: Self-pay | Admitting: Oncology

## 2011-12-27 ENCOUNTER — Ambulatory Visit: Payer: Self-pay | Admitting: Pain Medicine

## 2011-12-28 ENCOUNTER — Ambulatory Visit: Payer: Self-pay | Admitting: Oncology

## 2011-12-28 LAB — CBC CANCER CENTER
Basophil #: 0 x10 3/mm (ref 0.0–0.1)
Eosinophil #: 0.1 x10 3/mm (ref 0.0–0.7)
HCT: 35.8 % (ref 35.0–47.0)
HGB: 11.7 g/dL — ABNORMAL LOW (ref 12.0–16.0)
Lymphocyte #: 1.7 x10 3/mm (ref 1.0–3.6)
Lymphocyte %: 42 %
MCH: 27.6 pg (ref 26.0–34.0)
MCHC: 32.8 g/dL (ref 32.0–36.0)
MCV: 84 fL (ref 80–100)
Monocyte #: 0.2 x10 3/mm (ref 0.2–0.9)
Monocyte %: 5.9 %
Neutrophil #: 2 x10 3/mm (ref 1.4–6.5)
Neutrophil %: 49.5 %
Platelet: 166 x10 3/mm (ref 150–440)
RDW: 17 % — ABNORMAL HIGH (ref 11.5–14.5)
WBC: 3.9 x10 3/mm (ref 3.6–11.0)

## 2012-01-11 ENCOUNTER — Ambulatory Visit: Payer: Self-pay | Admitting: Oncology

## 2012-01-17 ENCOUNTER — Telehealth: Payer: Self-pay | Admitting: Internal Medicine

## 2012-01-17 NOTE — Telephone Encounter (Signed)
Er follow up 01/27/12  Pt stated she was in er in [place] pneium[place] anemic possible blood clot  Pt had labs ct xrays Pt has all discharged papers  Pt stated she is tired and nervous.   Pt stated her legs hurting at night Is  01/27/12 ok for follow up or does she need a sooner appointment Pt would like to talk to nurse about this Please advise about appointment or referral.

## 2012-01-27 ENCOUNTER — Encounter: Payer: Self-pay | Admitting: Internal Medicine

## 2012-01-27 ENCOUNTER — Ambulatory Visit (INDEPENDENT_AMBULATORY_CARE_PROVIDER_SITE_OTHER): Payer: Medicare Other | Admitting: Internal Medicine

## 2012-01-27 VITALS — BP 124/62 | HR 72 | Temp 98.0°F | Resp 16 | Wt 168.0 lb

## 2012-01-27 DIAGNOSIS — M1610 Unilateral primary osteoarthritis, unspecified hip: Secondary | ICD-10-CM

## 2012-01-27 DIAGNOSIS — M169 Osteoarthritis of hip, unspecified: Secondary | ICD-10-CM

## 2012-01-27 DIAGNOSIS — F411 Generalized anxiety disorder: Secondary | ICD-10-CM

## 2012-01-27 DIAGNOSIS — E119 Type 2 diabetes mellitus without complications: Secondary | ICD-10-CM

## 2012-01-27 DIAGNOSIS — J189 Pneumonia, unspecified organism: Secondary | ICD-10-CM

## 2012-01-27 MED ORDER — ALPRAZOLAM 0.25 MG PO TABS: 0.2500 mg | ORAL_TABLET | Freq: Every day | ORAL | Status: DC

## 2012-01-27 MED ORDER — SERTRALINE HCL 50 MG PO TABS: 50.0000 mg | ORAL_TABLET | Freq: Every day | ORAL | Status: DC

## 2012-01-27 NOTE — Patient Instructions (Addendum)
We are going to repeat a chest  Ray in early February to ensure that the changes seen are gone  We are starting generic zoloft for your anxiety.  Start with 1/2 tablet daily with lunch,  Increase to full tablet after one week.,  If it makes you sleepy change dosing to bedti  Referral to Evalina Field for talk therapy  I agree with postponing any spinal injections until the hips are resolved.   Dr Ernest Pine is the best orthopedist in [facility]   Check with Dr Milinda Cave office about whether you received the Pneumonia vaccine

## 2012-01-27 NOTE — Telephone Encounter (Signed)
Called pt on 01/17/12 no response.

## 2012-01-27 NOTE — Progress Notes (Signed)
Patient ID: Kim Hopkins, female   DOB: [birth year], 67 y.o.   MRN: [id number]   Patient Active Problem List  Diagnosis  . Cervical cancer screening  . History of gastric bypass  . Keloid skin disorder  . History of colonoscopy  . Fibromyalgia syndrome  . Osteopenia  . breast cancer  . Withdrawal symptoms, drug or narcotic  . Pneumonia  . Generalized anxiety disorder  . Degenerative arthritis of hip  . Diabetes mellitus    Subjective:  CC:   Chief Complaint  Patient presents with  . Follow-up    Hospital Follow Up    HPI:   Kim Hopkins a 67 y.o. female who presents for follow up on multiple issues.  Had left sided mastectomy on August 3rd by Cohen followed by XRT.  Has been seeing Dr. Metta Clines for pain management and recommended shots but PA Van Clines recommended not having the shots until the hip issues are dealt .  She is needing bilateral hip replacement,  But has misgivings because of mother's untimely death following a botched hip replacement and has a mistrust of doctors. .   Was treated in an ER in [place] while visiting son  for pleurisy from pneumonia,  Had been having a nonproductive cough  The night before along with severe diffuse body pain that was so severe she couldn't move. ER visit was  On dec 24th.  Also found to have mild iron deficiency anemia    Past Medical History  Diagnosis Date  . History of colonoscopy 2009    normal, next due 2019  . Fibromyalgia syndrome     managed with oxycodone  . Osteopenia   . allergic rhinitis   . breast cancer 2003    s/p L breast reconst with lat dorsi flap, expander, mastopexy  . S/P cervical spinal fusion 1990  . S/P gastric bypass 2003  . Glucose intolerance (impaired glucose tolerance)     hgba1c 6.16 May 2010   . Bipolar affective disorder   . Keloid skin disorder   . Osteopenia 2008    T score -1.8 L hip  . Pap smear for cervical cancer screening July 2011    Normal    Past Surgical History    Procedure Date  . Mastectomy, radical   . Gastric bypass   . Cesarean section   . Reconstruction breast w/ latissimus dorsi flap          The following portions of the patient's history were reviewed and updated as appropriate: Allergies, current medications, and problem list.    Review of Systems: Patient denies headache, fevers, , unintentional weight loss, skin rash, eye pain, sinus congestion and sinus pain, sore throat, dysphagia,  hemoptysis , cough, dyspnea, wheezing, chest pain, palpitations, orthopnea, edema, abdominal pain, nausea, melena, diarrhea, constipation, flank pain, dysuria, hematuria, urinary  Frequency, nocturia, numbness, tingling, seizures,  Focal weakness, Loss of consciousness,  Tremor, insomnia,  and suicidal ideation.      History   Social History  . Marital Status: Divorced    Spouse Name: N/A    Number of Children: N/A  . Years of Education: N/A   Occupational History  . Not on file.   Social History Main Topics  . Smoking status: Never Smoker   . Smokeless tobacco: Never Used  . Alcohol Use: No  . Drug Use: No  . Sexually Active: Not on file   Other Topics Concern  . Not on file   Social History Narrative  .  No narrative on file    Objective:  BP 124/62  Pulse 72  Temp 98 F (36.7 C) (Oral)  Resp 16  Wt 168 lb (76.204 kg)  SpO2 98%  General appearance: alert, cooperative and appears stated age Ears: normal TM's and external ear canals both ears Throat: lips, mucosa, and tongue normal; teeth and gums normal Neck: no adenopathy, no carotid bruit, supple, symmetrical, trachea midline and thyroid not enlarged, symmetric, no tenderness/mass/nodules Back: symmetric, no curvature. ROM normal. No CVA tenderness. Lungs: clear to auscultation bilaterally Heart: regular rate and rhythm, S1, S2 normal, no murmur, click, rub or gallop Abdomen: soft, non-tender; bowel sounds normal; no masses,  no organomegaly Pulses: 2+ and  symmetric Skin: Skin color, texture, turgor normal. No rashes or lesions Lymph nodes: Cervical, supraclavicular, and axillary nodes normal.  Assessment and Plan:  Pneumonia Symptoms now resolved,,  Will need follow up cxr in 6 weeks to ensure clearing of abnormality  Generalized anxiety disorder Trial of zoloft starting at 25 mg daiy.  Prior trial of cymbalta not tolerated.   Degenerative arthritis of hip Discussed surgery given her constant pain and antalgic gait. recommended Hooten.   Diabetes mellitus Diagnosed in March with a1c of 6.5  .diabetic eye exam normal .  sensation normal.  needs to return for fasting labs.     Updated Medication List Outpatient Encounter Prescriptions as of 01/27/2012  Medication Sig Dispense Refill  . Calcium Carbonate-Vitamin D (CALCIUM-VITAMIN D) 500-200 MG-UNIT per tablet Take 1 tablet by mouth 2 (two) times daily with a meal.      . Cyanocobalamin (B-12 PO) Take by mouth.      . diclofenac sodium (VOLTAREN) 1 % GEL Apply topically.      Marland Kitchen exemestane (AROMASIN) 25 MG tablet Take 1 tablet (25 mg total) by mouth daily after breakfast.  30 tablet  11  . fish oil-omega-3 fatty acids 1000 MG capsule Take 2 g by mouth daily.      . Glucosamine-Chondroitin (GLUCOSAMINE CHONDR COMPLEX PO) Take by mouth.      Marland Kitchen oxycodone (ROXICODONE) 30 MG immediate release tablet Take 1 tablet (30 mg total) by mouth every 4 (four) hours as needed.  150 tablet  0  . pregabalin (LYRICA) 150 MG capsule Take 1 capsule (150 mg total) by mouth 2 (two) times daily.  60 capsule  1  . Vitamin Mixture (VITAMIN E COMPLETE PO) Take by mouth.      . ALPRAZolam (XANAX) 0.25 MG tablet Take 1 tablet (0.25 mg total) by mouth daily.  30 tablet  3  . sertraline (ZOLOFT) 50 MG tablet Take 1 tablet (50 mg total) by mouth daily.  30 tablet  3  . [DISCONTINUED] calcium citrate-vitamin D (CITRACAL+D) 315-200 MG-UNIT per tablet Take 1 tablet by mouth 2 (two) times daily.           Orders Placed  This Encounter  Procedures  . DG Chest 2 View  . HM DEXA SCAN  . HM MAMMOGRAPHY  . HM MAMMOGRAPHY  . Hemoglobin A1c  . Lipid panel  . Comprehensive metabolic panel  . Microalbumin / creatinine urine ratio  . HM PAP SMEAR  . HM DIABETES EYE EXAM  . HM COLONOSCOPY    No Follow-up on file.

## 2012-01-29 ENCOUNTER — Encounter: Payer: Self-pay | Admitting: Internal Medicine

## 2012-01-29 DIAGNOSIS — J189 Pneumonia, unspecified organism: Secondary | ICD-10-CM | POA: Insufficient documentation

## 2012-01-29 DIAGNOSIS — M169 Osteoarthritis of hip, unspecified: Secondary | ICD-10-CM | POA: Insufficient documentation

## 2012-01-29 DIAGNOSIS — F411 Generalized anxiety disorder: Secondary | ICD-10-CM | POA: Insufficient documentation

## 2012-01-29 DIAGNOSIS — E119 Type 2 diabetes mellitus without complications: Secondary | ICD-10-CM | POA: Insufficient documentation

## 2012-01-29 NOTE — Assessment & Plan Note (Signed)
Discussed surgery given her constant pain and antalgic gait. recommended Hooten.

## 2012-01-29 NOTE — Assessment & Plan Note (Signed)
Symptoms now resolved,,  Will need follow up cxr in 6 weeks to ensure clearing of abnormality

## 2012-01-29 NOTE — Assessment & Plan Note (Signed)
Diagnosed in March with a1c of 6.5  .diabetic eye exam normal .  sensation normal.  needs to return for fasting labs.

## 2012-01-29 NOTE — Assessment & Plan Note (Signed)
Trial of zoloft starting at 25 mg daiy.  Prior trial of cymbalta not tolerated.

## 2012-02-01 ENCOUNTER — Ambulatory Visit: Payer: Self-pay | Admitting: Pain Medicine

## 2012-02-03 ENCOUNTER — Telehealth: Payer: Self-pay | Admitting: Internal Medicine

## 2012-02-03 NOTE — Telephone Encounter (Signed)
What type of iron do you recommend that she should take ?

## 2012-02-03 NOTE — Telephone Encounter (Signed)
Spoke to pt told her to take Ferrous Sulfate 324 mg one tablet by mouth daily per Dr. Darrick Huntsman. Pt verbalized understanding.

## 2012-02-03 NOTE — Telephone Encounter (Signed)
Ferrous sulfate  324 mg one tablet daily  Or Tandem

## 2012-02-16 ENCOUNTER — Telehealth: Payer: Self-pay | Admitting: Internal Medicine

## 2012-02-16 NOTE — Telephone Encounter (Signed)
Pt has an appt with you already on 2/18.

## 2012-02-16 NOTE — Telephone Encounter (Signed)
Patient wants to know if there is a test to check to see if she is prone for blood clots and she also wants to be checked for diabetes because of family history.

## 2012-02-16 NOTE — Telephone Encounter (Signed)
Have her make an appt to discuss.  I am not a grocery store.

## 2012-02-28 ENCOUNTER — Ambulatory Visit: Payer: Medicare Other | Admitting: Internal Medicine

## 2012-02-28 ENCOUNTER — Ambulatory Visit: Payer: Self-pay | Admitting: Pain Medicine

## 2012-03-28 ENCOUNTER — Ambulatory Visit: Payer: Self-pay | Admitting: Pain Medicine

## 2012-04-24 ENCOUNTER — Ambulatory Visit: Payer: Self-pay | Admitting: Oncology

## 2012-04-24 ENCOUNTER — Inpatient Hospital Stay: Payer: Self-pay | Admitting: Internal Medicine

## 2012-04-24 ENCOUNTER — Ambulatory Visit: Payer: Self-pay | Admitting: Family Medicine

## 2012-04-24 LAB — COMPREHENSIVE METABOLIC PANEL
Albumin: 2.9 g/dL — ABNORMAL LOW (ref 3.4–5.0)
Alkaline Phosphatase: 84 U/L (ref 50–136)
Anion Gap: 10 (ref 7–16)
BUN: 9 mg/dL (ref 7–18)
Bilirubin,Total: 0.5 mg/dL (ref 0.2–1.0)
Chloride: 101 mmol/L (ref 98–107)
Creatinine: 0.82 mg/dL (ref 0.60–1.30)
Glucose: 131 mg/dL — ABNORMAL HIGH (ref 65–99)
Osmolality: 276 (ref 275–301)
SGOT(AST): 24 U/L (ref 15–37)
Sodium: 138 mmol/L (ref 136–145)
Total Protein: 8 g/dL (ref 6.4–8.2)

## 2012-04-24 LAB — CBC WITH DIFFERENTIAL/PLATELET
Basophil %: 1.4 %
Eosinophil #: 0.1 10*3/uL (ref 0.0–0.7)
Eosinophil %: 1.8 %
HCT: 32.2 % — ABNORMAL LOW (ref 35.0–47.0)
Lymphocyte #: 1.3 10*3/uL (ref 1.0–3.6)
Lymphocyte %: 30.6 %
Monocyte #: 0.4 x10 3/mm (ref 0.2–0.9)
Monocyte %: 9.4 %
Neutrophil %: 56.8 %
RBC: 3.89 10*6/uL (ref 3.80–5.20)

## 2012-04-24 LAB — PRO B NATRIURETIC PEPTIDE: B-Type Natriuretic Peptide: 90 pg/mL (ref 0–125)

## 2012-04-26 LAB — CBC WITH DIFFERENTIAL/PLATELET
Basophil #: 0 10*3/uL (ref 0.0–0.1)
Basophil %: 1.2 %
HGB: 10.5 g/dL — ABNORMAL LOW (ref 12.0–16.0)
Lymphocyte #: 1.5 10*3/uL (ref 1.0–3.6)
MCH: 28 pg (ref 26.0–34.0)
MCV: 83 fL (ref 80–100)
Monocyte #: 0.3 x10 3/mm (ref 0.2–0.9)
Monocyte %: 8.1 %
Neutrophil %: 46.3 %
RBC: 3.76 10*6/uL — ABNORMAL LOW (ref 3.80–5.20)
WBC: 3.7 10*3/uL (ref 3.6–11.0)

## 2012-04-28 LAB — BASIC METABOLIC PANEL
Anion Gap: 6 — ABNORMAL LOW (ref 7–16)
BUN: 9 mg/dL (ref 7–18)
Calcium, Total: 8.9 mg/dL (ref 8.5–10.1)
Chloride: 104 mmol/L (ref 98–107)
Glucose: 150 mg/dL — ABNORMAL HIGH (ref 65–99)
Osmolality: 281 (ref 275–301)
Sodium: 140 mmol/L (ref 136–145)

## 2012-04-28 LAB — CREATININE, SERUM
Creatinine: 0.93 mg/dL (ref 0.60–1.30)
EGFR (African American): >60
EGFR (Non-African Amer.): >60

## 2012-05-01 ENCOUNTER — Ambulatory Visit: Payer: Self-pay | Admitting: Pain Medicine

## 2012-05-15 ENCOUNTER — Ambulatory Visit: Payer: Self-pay | Admitting: Specialist

## 2012-05-15 LAB — BODY FLUID CELL COUNT WITH DIFFERENTIAL
Basophil: 0 %
Eosinophil: 0 %
Lymphocytes: 83 %
Neutrophils: 5 %
Other Cells BF: 0 %

## 2012-05-15 LAB — PROTEIN, BODY FLUID: Protein, Body Fluid: 4.6 g/dL

## 2012-05-15 LAB — PROTIME-INR
INR: 1.1
Prothrombin Time: 14.8 secs — ABNORMAL HIGH (ref 11.5–14.7)

## 2012-05-15 LAB — LACTATE DEHYDROGENASE, PLEURAL OR PERITONEAL FLUID: LDH, Body Fluid: 562 U/L

## 2012-05-15 LAB — GLUCOSE, SEROUS FLUID: Glucose, Body Fluid: 53 mg/dL

## 2012-05-15 LAB — APTT: Activated PTT: 30.9 s

## 2012-05-18 ENCOUNTER — Other Ambulatory Visit: Payer: Self-pay

## 2012-05-25 ENCOUNTER — Ambulatory Visit: Payer: Self-pay | Admitting: Cardiothoracic Surgery

## 2012-05-28 ENCOUNTER — Ambulatory Visit: Payer: Self-pay | Admitting: Pain Medicine

## 2012-05-28 ENCOUNTER — Ambulatory Visit: Payer: Self-pay | Admitting: Cardiothoracic Surgery

## 2012-05-28 LAB — COMPREHENSIVE METABOLIC PANEL
BUN: 11 mg/dL (ref 7–18)
Calcium, Total: 9.5 mg/dL (ref 8.5–10.1)
Chloride: 101 mmol/L (ref 98–107)
Co2: 29 mmol/L (ref 21–32)
Creatinine: 0.94 mg/dL (ref 0.60–1.30)
EGFR (African American): >60
EGFR (Non-African Amer.): >60
Glucose: 120 mg/dL — ABNORMAL HIGH (ref 65–99)
SGOT(AST): 20 U/L (ref 15–37)
SGPT (ALT): 17 U/L (ref 12–78)
Sodium: 135 mmol/L — ABNORMAL LOW (ref 136–145)
Total Protein: 9.3 g/dL — ABNORMAL HIGH (ref 6.4–8.2)

## 2012-05-28 LAB — CBC WITH DIFFERENTIAL/PLATELET
Basophil #: 0 10*3/uL (ref 0.0–0.1)
Basophil %: 0.7 %
Eosinophil #: 0.1 10*3/uL (ref 0.0–0.7)
Eosinophil %: 1.5 %
HGB: 11 g/dL — ABNORMAL LOW (ref 12.0–16.0)
Lymphocyte #: 1 10*3/uL (ref 1.0–3.6)
MCHC: 32.7 g/dL (ref 32.0–36.0)
MCV: 82 fL (ref 80–100)
Monocyte %: 8.7 %
Neutrophil #: 3 10*3/uL (ref 1.4–6.5)
Platelet: 380 10*3/uL (ref 150–440)
RDW: 16.8 % — ABNORMAL HIGH (ref 11.5–14.5)
WBC: 4.5 10*3/uL (ref 3.6–11.0)

## 2012-05-28 LAB — PROTIME-INR
INR: 1.1
Prothrombin Time: 14.4 secs (ref 11.5–14.7)

## 2012-05-31 ENCOUNTER — Ambulatory Visit: Payer: Self-pay | Admitting: Pain Medicine

## 2012-06-05 ENCOUNTER — Ambulatory Visit: Payer: Self-pay

## 2012-06-10 ENCOUNTER — Ambulatory Visit: Payer: Self-pay | Admitting: Internal Medicine

## 2012-06-21 ENCOUNTER — Ambulatory Visit: Payer: Self-pay | Admitting: Cardiothoracic Surgery

## 2012-06-28 ENCOUNTER — Ambulatory Visit: Payer: Self-pay | Admitting: Pain Medicine

## 2012-07-06 ENCOUNTER — Encounter: Payer: Self-pay | Admitting: Pain Medicine

## 2012-07-10 ENCOUNTER — Ambulatory Visit: Payer: Self-pay | Admitting: Cardiothoracic Surgery

## 2012-07-30 ENCOUNTER — Ambulatory Visit: Payer: Self-pay | Admitting: Pain Medicine

## 2012-08-10 ENCOUNTER — Ambulatory Visit: Payer: Self-pay | Admitting: Cardiothoracic Surgery

## 2012-08-28 ENCOUNTER — Ambulatory Visit: Payer: Self-pay | Admitting: Pain Medicine

## 2012-09-17 ENCOUNTER — Encounter: Payer: Self-pay | Admitting: Pain Medicine

## 2012-09-19 ENCOUNTER — Emergency Department: Payer: Self-pay | Admitting: Emergency Medicine

## 2012-09-26 ENCOUNTER — Ambulatory Visit: Payer: Self-pay | Admitting: Pain Medicine

## 2012-10-02 ENCOUNTER — Ambulatory Visit: Payer: Self-pay | Admitting: Oncology

## 2012-10-03 ENCOUNTER — Ambulatory Visit: Payer: Self-pay | Admitting: Oncology

## 2012-10-10 ENCOUNTER — Ambulatory Visit: Payer: Self-pay | Admitting: Oncology

## 2012-10-10 ENCOUNTER — Encounter: Payer: Self-pay | Admitting: Pain Medicine

## 2012-10-25 ENCOUNTER — Ambulatory Visit: Payer: Self-pay | Admitting: Pain Medicine

## 2012-11-27 ENCOUNTER — Ambulatory Visit: Payer: Self-pay | Admitting: Pain Medicine

## 2012-12-13 ENCOUNTER — Ambulatory Visit (HOSPITAL_BASED_OUTPATIENT_CLINIC_OR_DEPARTMENT_OTHER): Payer: Medicare Other

## 2012-12-27 ENCOUNTER — Ambulatory Visit (HOSPITAL_BASED_OUTPATIENT_CLINIC_OR_DEPARTMENT_OTHER): Payer: Medicare Other

## 2013-01-01 ENCOUNTER — Ambulatory Visit (HOSPITAL_BASED_OUTPATIENT_CLINIC_OR_DEPARTMENT_OTHER): Payer: Self-pay

## 2013-01-01 ENCOUNTER — Other Ambulatory Visit (HOSPITAL_BASED_OUTPATIENT_CLINIC_OR_DEPARTMENT_OTHER): Payer: Medicare Other

## 2013-01-01 ENCOUNTER — Encounter (HOSPITAL_BASED_OUTPATIENT_CLINIC_OR_DEPARTMENT_OTHER): Payer: Medicare Other | Admitting: Hematology & Oncology

## 2013-01-01 ENCOUNTER — Ambulatory Visit (HOSPITAL_BASED_OUTPATIENT_CLINIC_OR_DEPARTMENT_OTHER): Payer: Medicare Other

## 2013-01-02 ENCOUNTER — Other Ambulatory Visit: Payer: Self-pay | Admitting: Hematology & Oncology

## 2013-01-02 ENCOUNTER — Ambulatory Visit (HOSPITAL_BASED_OUTPATIENT_CLINIC_OR_DEPARTMENT_OTHER): Payer: Medicare Other

## 2013-01-02 ENCOUNTER — Other Ambulatory Visit (HOSPITAL_BASED_OUTPATIENT_CLINIC_OR_DEPARTMENT_OTHER): Payer: Self-pay

## 2013-01-02 ENCOUNTER — Ambulatory Visit: Payer: Medicare Other | Attending: Hematology & Oncology

## 2013-01-02 ENCOUNTER — Ambulatory Visit (HOSPITAL_BASED_OUTPATIENT_CLINIC_OR_DEPARTMENT_OTHER): Payer: Medicare Other | Admitting: Hematology & Oncology

## 2013-01-02 DIAGNOSIS — Z1231 Encounter for screening mammogram for malignant neoplasm of breast: Secondary | ICD-10-CM | POA: Insufficient documentation

## 2013-01-02 DIAGNOSIS — M25469 Effusion, unspecified knee: Secondary | ICD-10-CM

## 2013-01-02 DIAGNOSIS — Z853 Personal history of malignant neoplasm of breast: Secondary | ICD-10-CM

## 2013-01-02 DIAGNOSIS — C50919 Malignant neoplasm of unspecified site of unspecified female breast: Secondary | ICD-10-CM | POA: Insufficient documentation

## 2013-01-02 LAB — CBC, ABS NEUTROPHIL
Hematocrit: 35 % — ABNORMAL LOW (ref 36–45)
Hemoglobin: 11.1 g/dL — ABNORMAL LOW (ref 11.5–15.5)
Immature Granulocytes: 0 10*3/uL (ref 0.00–0.05)
MCH: 25.8 pg — ABNORMAL LOW (ref 27.3–33.6)
MCHC: 31.9 g/dL — ABNORMAL LOW (ref 32.2–36.5)
MCV: 81 fL (ref 81–98)
Neutrophils: 2.33 10*3/uL (ref 1.80–7.00)
Platelet Count: 215 10*3/uL (ref 150–400)
RBC: 4.31 mil/uL (ref 3.80–5.00)
RDW-CV: 16.2 % — ABNORMAL HIGH (ref 11.6–14.4)
WBC: 4.09 10*3/uL — ABNORMAL LOW (ref 4.3–10.0)

## 2013-01-02 LAB — BASIC METABOLIC PANEL
Anion Gap: 6 (ref 3–11)
Calcium: 9.2 mg/dL (ref 8.9–10.2)
Carbon Dioxide, Total: 29 mEq/L (ref 22–32)
Chloride: 101 mEq/L (ref 98–108)
Creatinine: 0.79 mg/dL (ref 0.38–1.02)
GFR, Calc, African American: >60 mL/min (ref >59)
GFR, Calc, European American: >60 mL/min (ref >59)
Glucose: 148 mg/dL — ABNORMAL HIGH (ref 62–125)
Potassium: 3.7 mEq/L (ref 3.7–5.2)
Sodium: 136 mEq/L (ref 136–145)
Urea Nitrogen: 13 mg/dL (ref 8–21)

## 2013-01-02 LAB — HEPATIC FUNCTION PANEL W/ LD
ALT (GPT): 13 U/L (ref 6–40)
AST (GOT): 24 U/L (ref 15–40)
Albumin: 3.7 g/dL (ref 3.5–5.2)
Alkaline Phosphatase (Total): 104 U/L (ref 38–172)
Bilirubin (Direct): 0.1 mg/dL (ref 0.0–0.3)
Bilirubin (Total): 0.4 mg/dL (ref 0.2–1.3)
Lactate Dehydrogenase: 163 U/L (ref 80–190)
Protein (Total): 8.6 g/dL — ABNORMAL HIGH (ref 6.0–8.2)

## 2013-01-02 LAB — CARCINOEMBRYONIC ANTIGEN: Carcinoembryonic Antigen: 2.1 ng/mL (ref 0.0–5.0)

## 2013-01-04 LAB — CA 27.29: Ca 27.29: 44 U/mL — ABNORMAL HIGH (ref 0–37)

## 2013-01-05 LAB — VITAMIN D (25 HYDROXY)
Vit D (25_Hydroxy) Total: 26.4 ng/mL (ref 20.1–50.0)
Vitamin D2 (25_Hydroxy): 5.6 ng/mL
Vitamin D3 (25_Hydroxy): 20.8 ng/mL

## 2013-01-09 ENCOUNTER — Other Ambulatory Visit: Payer: Self-pay | Admitting: Hematology & Oncology

## 2013-01-09 ENCOUNTER — Ambulatory Visit: Payer: Medicare Other | Attending: Hematology & Oncology

## 2013-01-09 DIAGNOSIS — M949 Disorder of cartilage, unspecified: Secondary | ICD-10-CM

## 2013-01-09 DIAGNOSIS — M899 Disorder of bone, unspecified: Secondary | ICD-10-CM

## 2013-01-17 ENCOUNTER — Ambulatory Visit (HOSPITAL_BASED_OUTPATIENT_CLINIC_OR_DEPARTMENT_OTHER): Payer: Medicare Other | Admitting: Rehabilitative and Restorative Service Providers"

## 2013-01-22 ENCOUNTER — Ambulatory Visit: Payer: Self-pay | Admitting: Pain Medicine

## 2013-02-14 ENCOUNTER — Encounter (HOSPITAL_BASED_OUTPATIENT_CLINIC_OR_DEPARTMENT_OTHER): Payer: Medicare Other | Admitting: Psychiatry

## 2013-02-19 ENCOUNTER — Ambulatory Visit: Payer: Medicare Other | Attending: Anesthesiology | Admitting: Anesthesiology

## 2013-02-19 DIAGNOSIS — Z853 Personal history of malignant neoplasm of breast: Secondary | ICD-10-CM | POA: Insufficient documentation

## 2013-02-19 DIAGNOSIS — Z5181 Encounter for therapeutic drug level monitoring: Secondary | ICD-10-CM | POA: Insufficient documentation

## 2013-02-19 DIAGNOSIS — M129 Arthropathy, unspecified: Secondary | ICD-10-CM | POA: Insufficient documentation

## 2013-02-19 DIAGNOSIS — M25569 Pain in unspecified knee: Secondary | ICD-10-CM | POA: Insufficient documentation

## 2013-02-19 DIAGNOSIS — M25559 Pain in unspecified hip: Secondary | ICD-10-CM | POA: Insufficient documentation

## 2013-02-19 DIAGNOSIS — M1610 Unilateral primary osteoarthritis, unspecified hip: Secondary | ICD-10-CM

## 2013-02-19 DIAGNOSIS — IMO0001 Reserved for inherently not codable concepts without codable children: Secondary | ICD-10-CM

## 2013-02-19 DIAGNOSIS — Z79899 Other long term (current) drug therapy: Secondary | ICD-10-CM | POA: Insufficient documentation

## 2013-02-28 ENCOUNTER — Ambulatory Visit: Attending: Psychiatry | Admitting: Psychiatry

## 2013-02-28 DIAGNOSIS — F3189 Other bipolar disorder: Secondary | ICD-10-CM | POA: Insufficient documentation

## 2013-03-26 ENCOUNTER — Other Ambulatory Visit (HOSPITAL_BASED_OUTPATIENT_CLINIC_OR_DEPARTMENT_OTHER): Payer: Self-pay | Admitting: Hematology & Oncology

## 2013-03-26 ENCOUNTER — Ambulatory Visit (HOSPITAL_BASED_OUTPATIENT_CLINIC_OR_DEPARTMENT_OTHER): Payer: Medicare Other

## 2013-03-26 ENCOUNTER — Ambulatory Visit: Payer: Medicare Other | Attending: Hematology & Oncology | Admitting: Hematology & Oncology

## 2013-03-26 DIAGNOSIS — Z1502 Genetic susceptibility to malignant neoplasm of ovary: Secondary | ICD-10-CM | POA: Insufficient documentation

## 2013-03-26 DIAGNOSIS — Z853 Personal history of malignant neoplasm of breast: Secondary | ICD-10-CM | POA: Insufficient documentation

## 2013-03-26 DIAGNOSIS — E119 Type 2 diabetes mellitus without complications: Secondary | ICD-10-CM | POA: Insufficient documentation

## 2013-03-26 DIAGNOSIS — I4891 Unspecified atrial fibrillation: Secondary | ICD-10-CM | POA: Insufficient documentation

## 2013-03-26 DIAGNOSIS — C50919 Malignant neoplasm of unspecified site of unspecified female breast: Secondary | ICD-10-CM | POA: Insufficient documentation

## 2013-03-26 DIAGNOSIS — M7120 Synovial cyst of popliteal space [Baker], unspecified knee: Secondary | ICD-10-CM | POA: Insufficient documentation

## 2013-03-26 DIAGNOSIS — I10 Essential (primary) hypertension: Secondary | ICD-10-CM | POA: Insufficient documentation

## 2013-03-26 LAB — CBC, DIFF
% Basophils: 0 %
% Eosinophils: 2 %
% Immature Granulocytes: 0 %
% Lymphocytes: 32 %
% Monocytes: 5 %
% Neutrophils: 61 %
Absolute Eosinophil Count: 0.11 10*3/uL (ref 0.00–0.50)
Absolute Lymphocyte Count: 1.52 10*3/uL (ref 1.00–4.80)
Basophils: 0.02 10*3/uL (ref 0.00–0.20)
Hematocrit: 35 % — ABNORMAL LOW (ref 36–45)
Hemoglobin: 11.4 g/dL — ABNORMAL LOW (ref 11.5–15.5)
Immature Granulocytes: 0 10*3/uL (ref 0.00–0.05)
MCH: 27.1 pg — ABNORMAL LOW (ref 27.3–33.6)
MCHC: 32.3 g/dL (ref 32.2–36.5)
MCV: 84 fL (ref 81–98)
Monocytes: 0.22 10*3/uL (ref 0.00–0.80)
Neutrophils: 2.88 10*3/uL (ref 1.80–7.00)
Platelet Count: 206 10*3/uL (ref 150–400)
RBC: 4.21 mil/uL (ref 3.80–5.00)
RDW-CV: 16.6 % — ABNORMAL HIGH (ref 11.6–14.4)
WBC: 4.75 10*3/uL (ref 4.3–10.0)

## 2013-03-26 LAB — HEPATIC FUNCTION PANEL W/ LD
ALT (GPT): 9 U/L (ref 7–33)
AST (GOT): 17 U/L (ref 9–38)
Albumin: 4 g/dL (ref 3.5–5.2)
Alkaline Phosphatase (Total): 105 U/L (ref 38–172)
Bilirubin (Direct): 0.1 mg/dL (ref 0.0–0.3)
Bilirubin (Total): 0.4 mg/dL (ref 0.2–1.3)
Lactate Dehydrogenase: 166 U/L (ref <210)
Protein (Total): 7.9 g/dL (ref 6.0–8.2)

## 2013-03-26 LAB — BASIC METABOLIC PANEL
Anion Gap: 3 — ABNORMAL LOW (ref 4–12)
Calcium: 9.5 mg/dL (ref 8.9–10.2)
Carbon Dioxide, Total: 31 mEq/L (ref 22–32)
Chloride: 101 mEq/L (ref 98–108)
Creatinine: 0.65 mg/dL (ref 0.38–1.02)
GFR, Calc, African American: >60 mL/min (ref >59)
GFR, Calc, European American: >60 mL/min (ref >59)
Glucose: 115 mg/dL (ref 62–125)
Potassium: 4 mEq/L (ref 3.6–5.2)
Sodium: 135 mEq/L (ref 135–145)
Urea Nitrogen: 10 mg/dL (ref 8–21)

## 2013-03-28 ENCOUNTER — Encounter (HOSPITAL_BASED_OUTPATIENT_CLINIC_OR_DEPARTMENT_OTHER): Payer: Self-pay | Admitting: Psychiatry

## 2013-04-11 ENCOUNTER — Encounter (HOSPITAL_BASED_OUTPATIENT_CLINIC_OR_DEPARTMENT_OTHER): Payer: Medicare Other | Admitting: Psychiatry

## 2013-05-09 ENCOUNTER — Encounter (HOSPITAL_BASED_OUTPATIENT_CLINIC_OR_DEPARTMENT_OTHER): Payer: Medicare Other | Admitting: Anesthesiology

## 2013-05-23 ENCOUNTER — Encounter (HOSPITAL_BASED_OUTPATIENT_CLINIC_OR_DEPARTMENT_OTHER): Payer: Medicare Other | Admitting: Psychiatry

## 2013-10-29 ENCOUNTER — Other Ambulatory Visit (HOSPITAL_BASED_OUTPATIENT_CLINIC_OR_DEPARTMENT_OTHER): Payer: Medicare Other

## 2013-10-29 ENCOUNTER — Encounter (HOSPITAL_BASED_OUTPATIENT_CLINIC_OR_DEPARTMENT_OTHER): Payer: Medicare Other | Admitting: Hematology & Oncology

## 2013-11-05 ENCOUNTER — Encounter (HOSPITAL_BASED_OUTPATIENT_CLINIC_OR_DEPARTMENT_OTHER): Payer: Medicare Other | Admitting: Hematology & Oncology

## 2013-11-05 ENCOUNTER — Other Ambulatory Visit (HOSPITAL_BASED_OUTPATIENT_CLINIC_OR_DEPARTMENT_OTHER): Payer: Medicare Other

## 2013-11-26 ENCOUNTER — Encounter (HOSPITAL_BASED_OUTPATIENT_CLINIC_OR_DEPARTMENT_OTHER): Payer: Medicare Other | Admitting: Hematology & Oncology

## 2013-11-26 ENCOUNTER — Ambulatory Visit: Payer: Medicare Other | Attending: Hematology & Oncology

## 2013-11-26 DIAGNOSIS — C50919 Malignant neoplasm of unspecified site of unspecified female breast: Secondary | ICD-10-CM | POA: Insufficient documentation

## 2013-11-26 LAB — CBC, DIFF
% Basophils: 1 %
% Eosinophils: 1 %
% Immature Granulocytes: 0 %
% Lymphocytes: 35 %
% Monocytes: 7 %
% Neutrophils: 56 %
Absolute Eosinophil Count: 0.04 10*3/uL (ref 0.00–0.50)
Absolute Lymphocyte Count: 1.33 10*3/uL (ref 1.00–4.80)
Basophils: 0.03 10*3/uL (ref 0.00–0.20)
Hematocrit: 34 % — ABNORMAL LOW (ref 36–45)
Hematocrit: 34 % — ABNORMAL LOW (ref 36–45)
Hemoglobin: 11.3 g/dL — ABNORMAL LOW (ref 11.5–15.5)
Hemoglobin: 11.3 g/dL — ABNORMAL LOW (ref 11.5–15.5)
Immature Granulocytes: 0.01 10*3/uL (ref 0.00–0.05)
MCH: 26.6 pg — ABNORMAL LOW (ref 27.3–33.6)
MCH: 26.6 pg — ABNORMAL LOW (ref 27.3–33.6)
MCHC: 32.9 g/dL (ref 32.2–36.5)
MCHC: 32.9 g/dL (ref 32.2–36.5)
MCV: 81 fL (ref 81–98)
MCV: 81 fL (ref 81–98)
Monocytes: 0.26 10*3/uL (ref 0.00–0.80)
Neutrophils: 2.15 10*3/uL (ref 1.80–7.00)
Platelet Count: 255 10*3/uL (ref 150–400)
Platelet Count: 255 10*3/uL (ref 150–400)
RBC: 4.25 mil/uL (ref 3.80–5.00)
RBC: 4.25 mil/uL (ref 3.80–5.00)
RDW-CV: 15.3 % — ABNORMAL HIGH (ref 11.6–14.4)
RDW-CV: 15.3 % — ABNORMAL HIGH (ref 11.6–14.4)
WBC: 3.82 10*3/uL — ABNORMAL LOW (ref 4.3–10.0)
WBC: 3.82 10*3/uL — ABNORMAL LOW (ref 4.3–10.0)

## 2013-11-26 LAB — COMP METABOLIC PANEL
ALT (GPT): 7 U/L (ref 7–33)
AST (GOT): 14 U/L (ref 9–38)
Albumin/Globulin Ratio: 0.8 — ABNORMAL LOW (ref 1.0–2.4)
Albumin: 3.8 g/dL (ref 3.5–5.2)
Alkaline Phosphatase (Total): 92 U/L (ref 38–172)
Anion Gap: 7 (ref 4–12)
Bilirubin (Total): 0.4 mg/dL (ref 0.2–1.3)
Calcium: 9.4 mg/dL (ref 8.9–10.2)
Carbon Dioxide, Total: 28 mEq/L (ref 22–32)
Chloride: 101 mEq/L (ref 98–108)
Creatinine: 0.75 mg/dL (ref 0.38–1.02)
GFR, Calc, African American: >60 mL/min (ref >59)
GFR, Calc, European American: >60 mL/min (ref >59)
Globulin: 4.7 g/dL — ABNORMAL HIGH (ref 2.0–3.9)
Glucose: 121 mg/dL (ref 62–125)
Potassium: 4.1 mEq/L (ref 3.6–5.2)
Protein (Total): 8.5 g/dL — ABNORMAL HIGH (ref 6.0–8.2)
Sodium: 136 mEq/L (ref 135–145)
Urea Nitrogen: 16 mg/dL (ref 8–21)

## 2013-11-26 LAB — CARCINOEMBRYONIC ANTIGEN: Carcinoembryonic Antigen: 2.5 ng/mL (ref 0.0–5.0)

## 2013-11-27 LAB — CA 27.29: Ca 27.29: 38 U/mL — ABNORMAL HIGH (ref 0–37)

## 2013-12-04 ENCOUNTER — Ambulatory Visit: Payer: Medicare Other | Attending: Hematology & Oncology | Admitting: Hematology & Oncology

## 2013-12-04 DIAGNOSIS — Z9012 Acquired absence of left breast and nipple: Secondary | ICD-10-CM | POA: Insufficient documentation

## 2013-12-04 DIAGNOSIS — I10 Essential (primary) hypertension: Secondary | ICD-10-CM | POA: Insufficient documentation

## 2013-12-04 DIAGNOSIS — Z853 Personal history of malignant neoplasm of breast: Secondary | ICD-10-CM

## 2013-12-04 DIAGNOSIS — E119 Type 2 diabetes mellitus without complications: Secondary | ICD-10-CM | POA: Insufficient documentation

## 2013-12-04 DIAGNOSIS — Z170 Estrogen receptor positive status [ER+]: Secondary | ICD-10-CM | POA: Insufficient documentation

## 2013-12-04 DIAGNOSIS — Z1502 Genetic susceptibility to malignant neoplasm of ovary: Secondary | ICD-10-CM | POA: Insufficient documentation

## 2013-12-04 DIAGNOSIS — Z08 Encounter for follow-up examination after completed treatment for malignant neoplasm: Secondary | ICD-10-CM | POA: Insufficient documentation

## 2014-01-07 ENCOUNTER — Ambulatory Visit: Payer: Medicare Other | Attending: Hematology & Oncology

## 2014-01-07 DIAGNOSIS — Z1231 Encounter for screening mammogram for malignant neoplasm of breast: Secondary | ICD-10-CM

## 2014-03-04 IMAGING — US US EXTREM LOW VENOUS BILAT
1 series · 14 of 24 positions shown · non-contrast
Comparison: none

REASON FOR EXAM: leg tender
COMMENTS:

PROCEDURE:     US  - US DOPPLER LOW EXTR BILATERAL  - April 25, 2012 [DATE]
RESULT:     Technique: Grayscale, duplex, color flow and SPECTRAL waveform
imaging was performed to interrogate the deep venous structures of the right
and left lower extremities.

[Series 1: us extrem low venous bilat · 0.09mm/px · 14 of 48 slices shown]
[im 1/48]
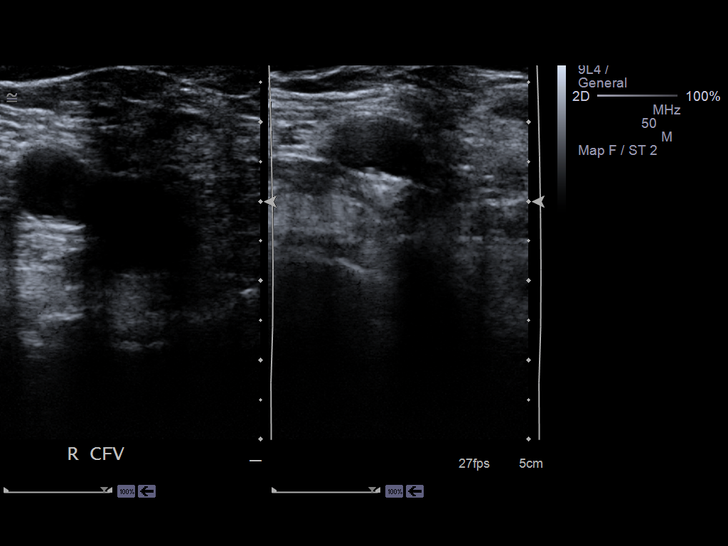
[im 5/48]
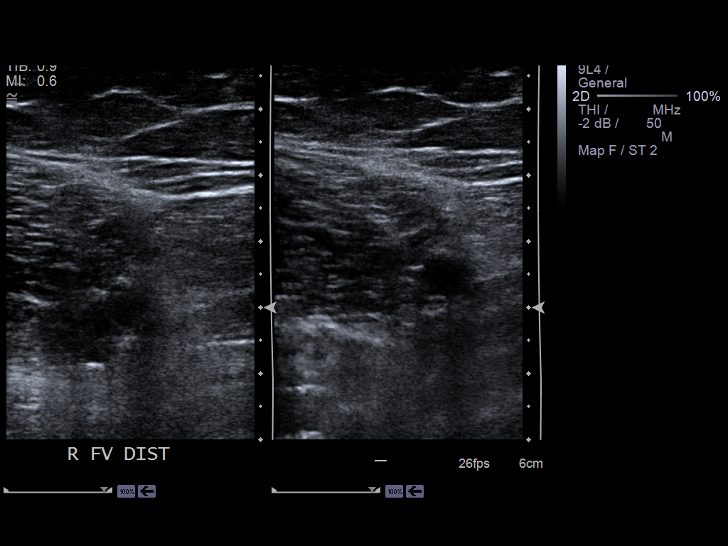
[im 9/48]
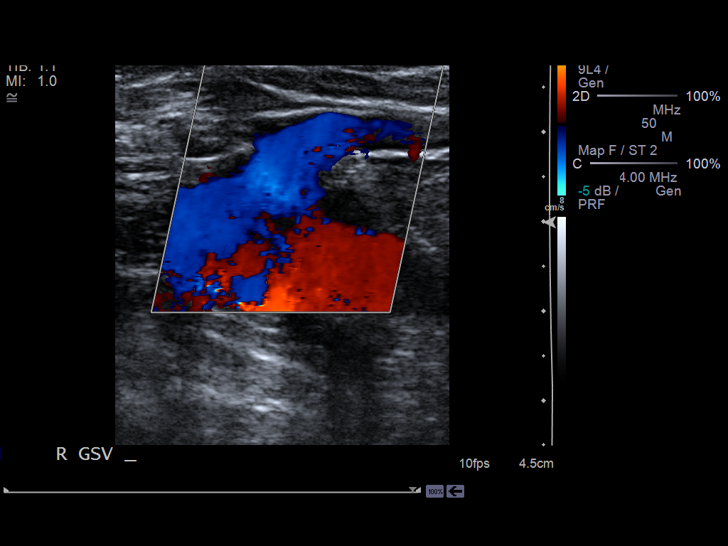
[im 13/48]
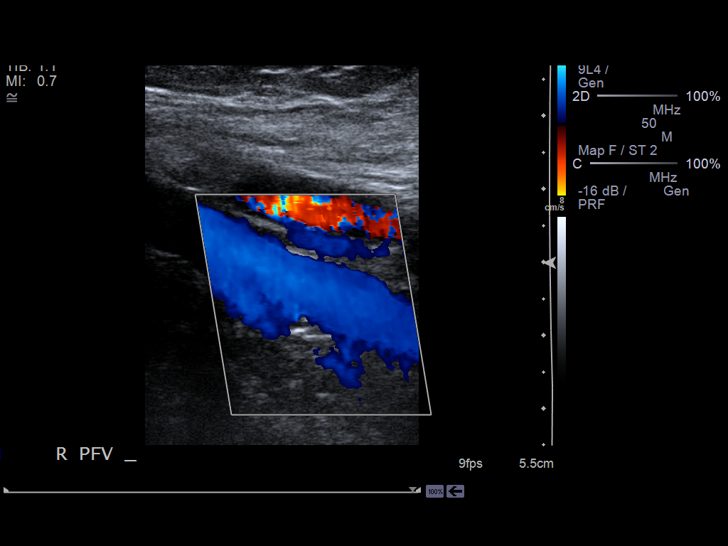
[im 15/48]
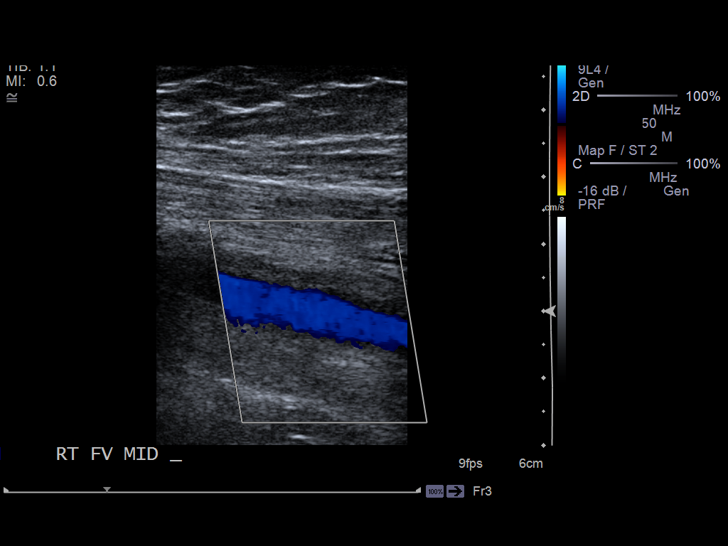
[im 19/48]
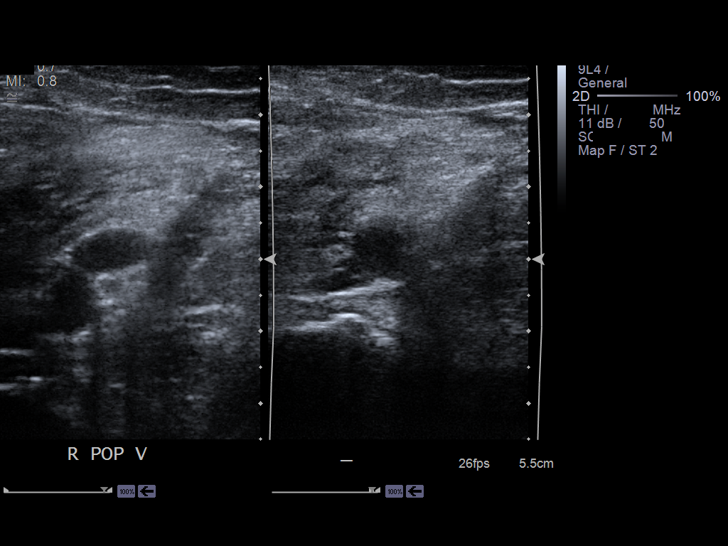
[im 23/48]
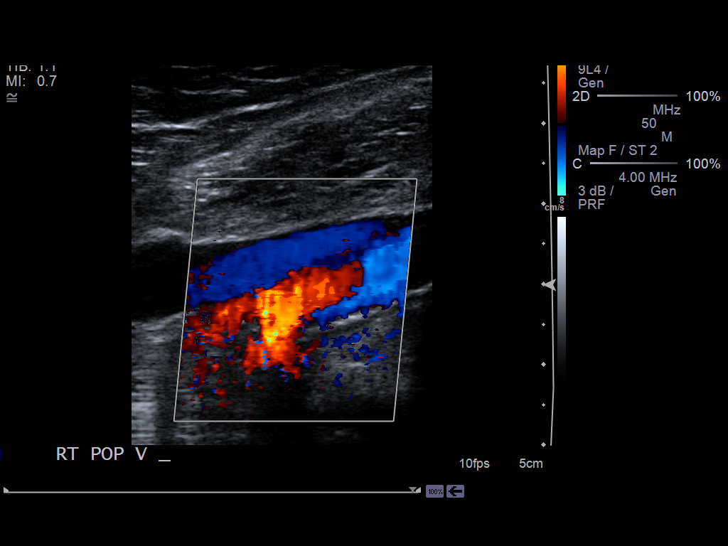
[im 25/48]
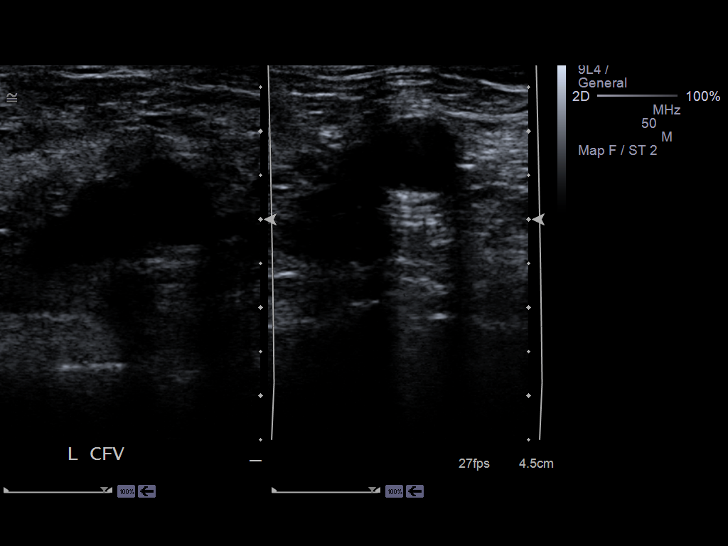
[im 29/48]
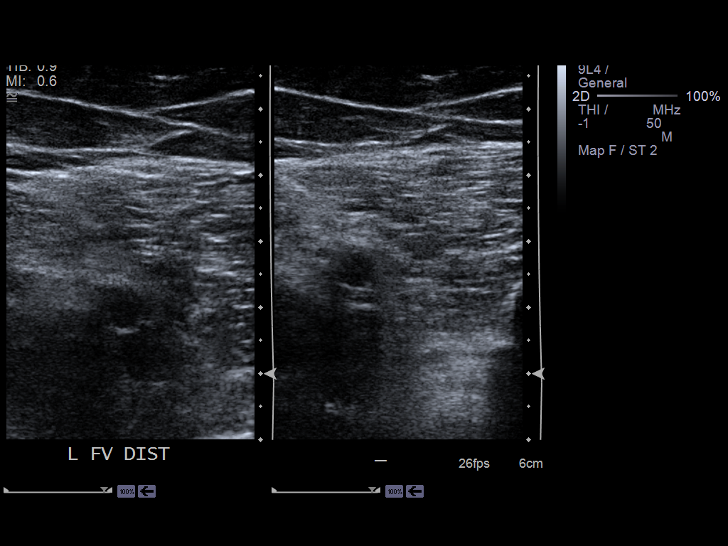
[im 33/48]
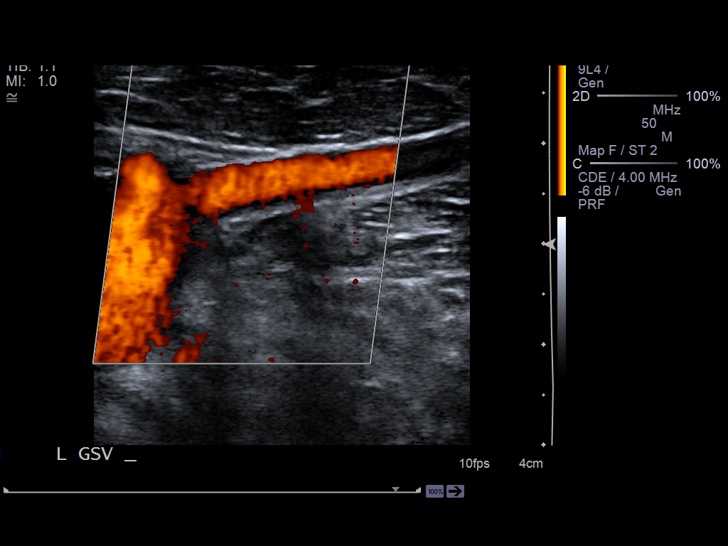
[im 37/48]
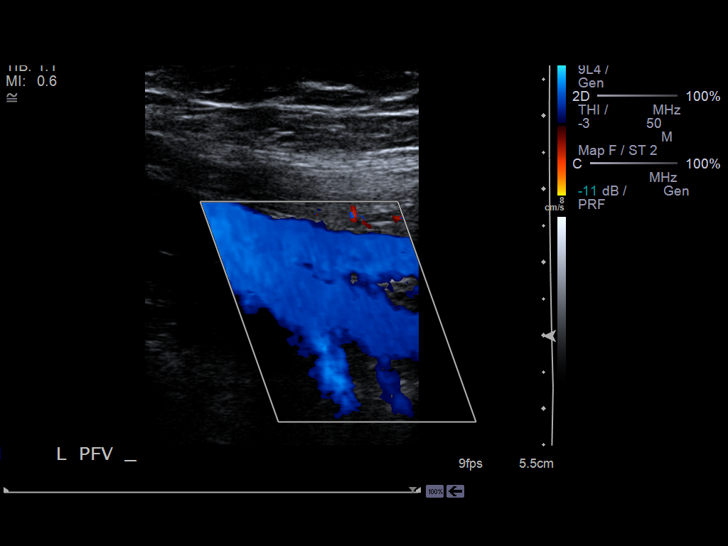
[im 39/48]
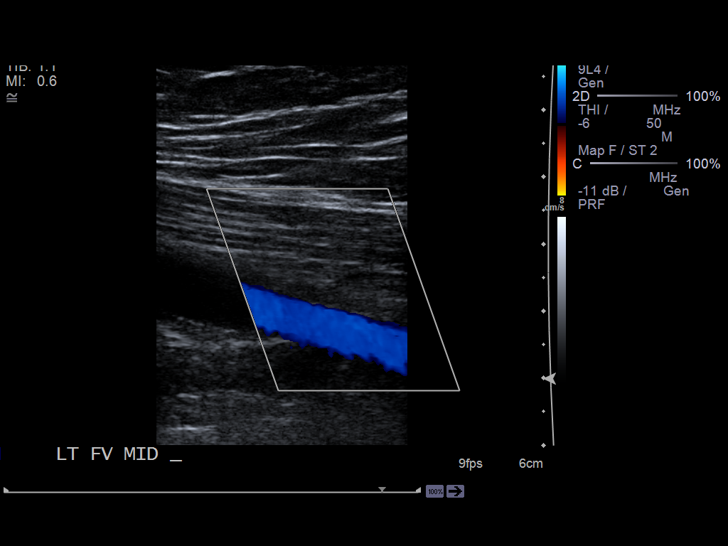
[im 43/48]
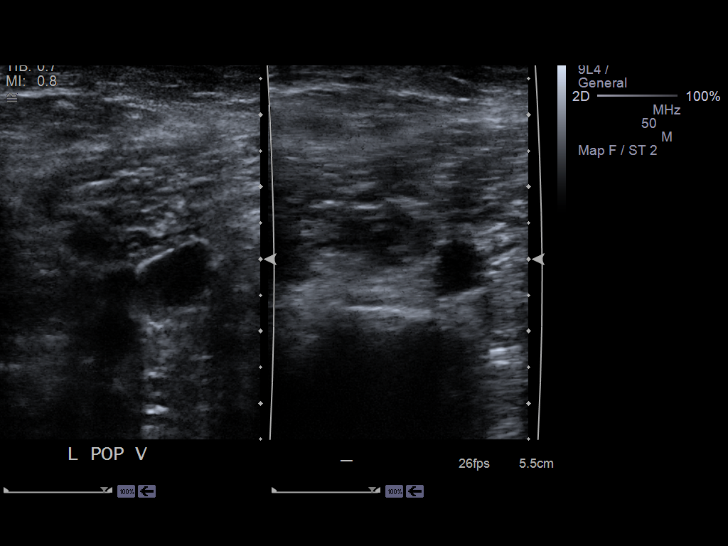
[im 48/48]
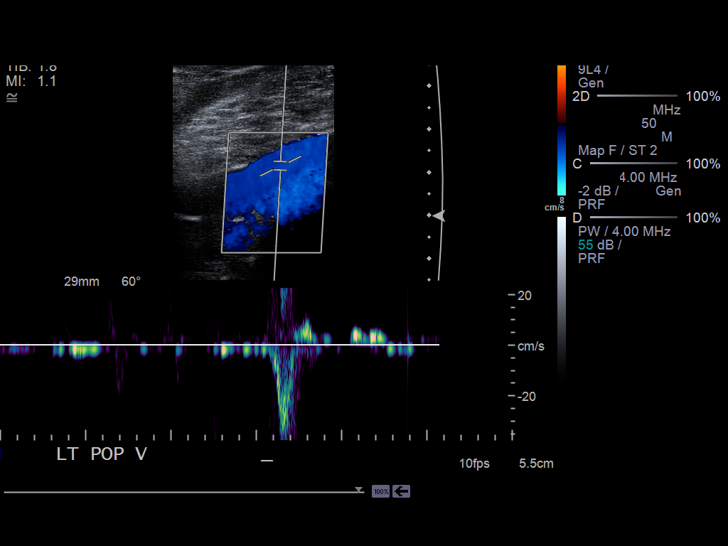

[14 of 24 positions shown; findings below may reference images not displayed]

FINDINGS: There is no evidence of increased echogenicity, non-compressibility,
abnormal waveforms nor abnormal color flow within the interrogated deep
venous structures of the right or left lower extremity. There is an
appropriate response to Valsalva and augmentation.
IMPRESSION: 1.  No sonographic evidence of a deep venous thrombus within the right or
left lower extremity.

## 2014-03-05 ENCOUNTER — Other Ambulatory Visit: Payer: Self-pay

## 2014-03-05 ENCOUNTER — Ambulatory Visit (HOSPITAL_BASED_OUTPATIENT_CLINIC_OR_DEPARTMENT_OTHER)
Admit: 2014-03-05 | Discharge: 2014-03-05 | Disposition: A | Discharge status: Home / Self Care | Payer: Medicare Other | Attending: Infectious Disease | Admitting: Infectious Disease

## 2014-03-06 IMAGING — CR DG CHEST 2V
1 series · 2 of 2 positions shown · non-contrast
Comparison: none

REASON FOR EXAM: cough
COMMENTS:

[Series 1: w chest pa · 0.14mm/px · 2 of 2 slices shown]
[im 1/2]
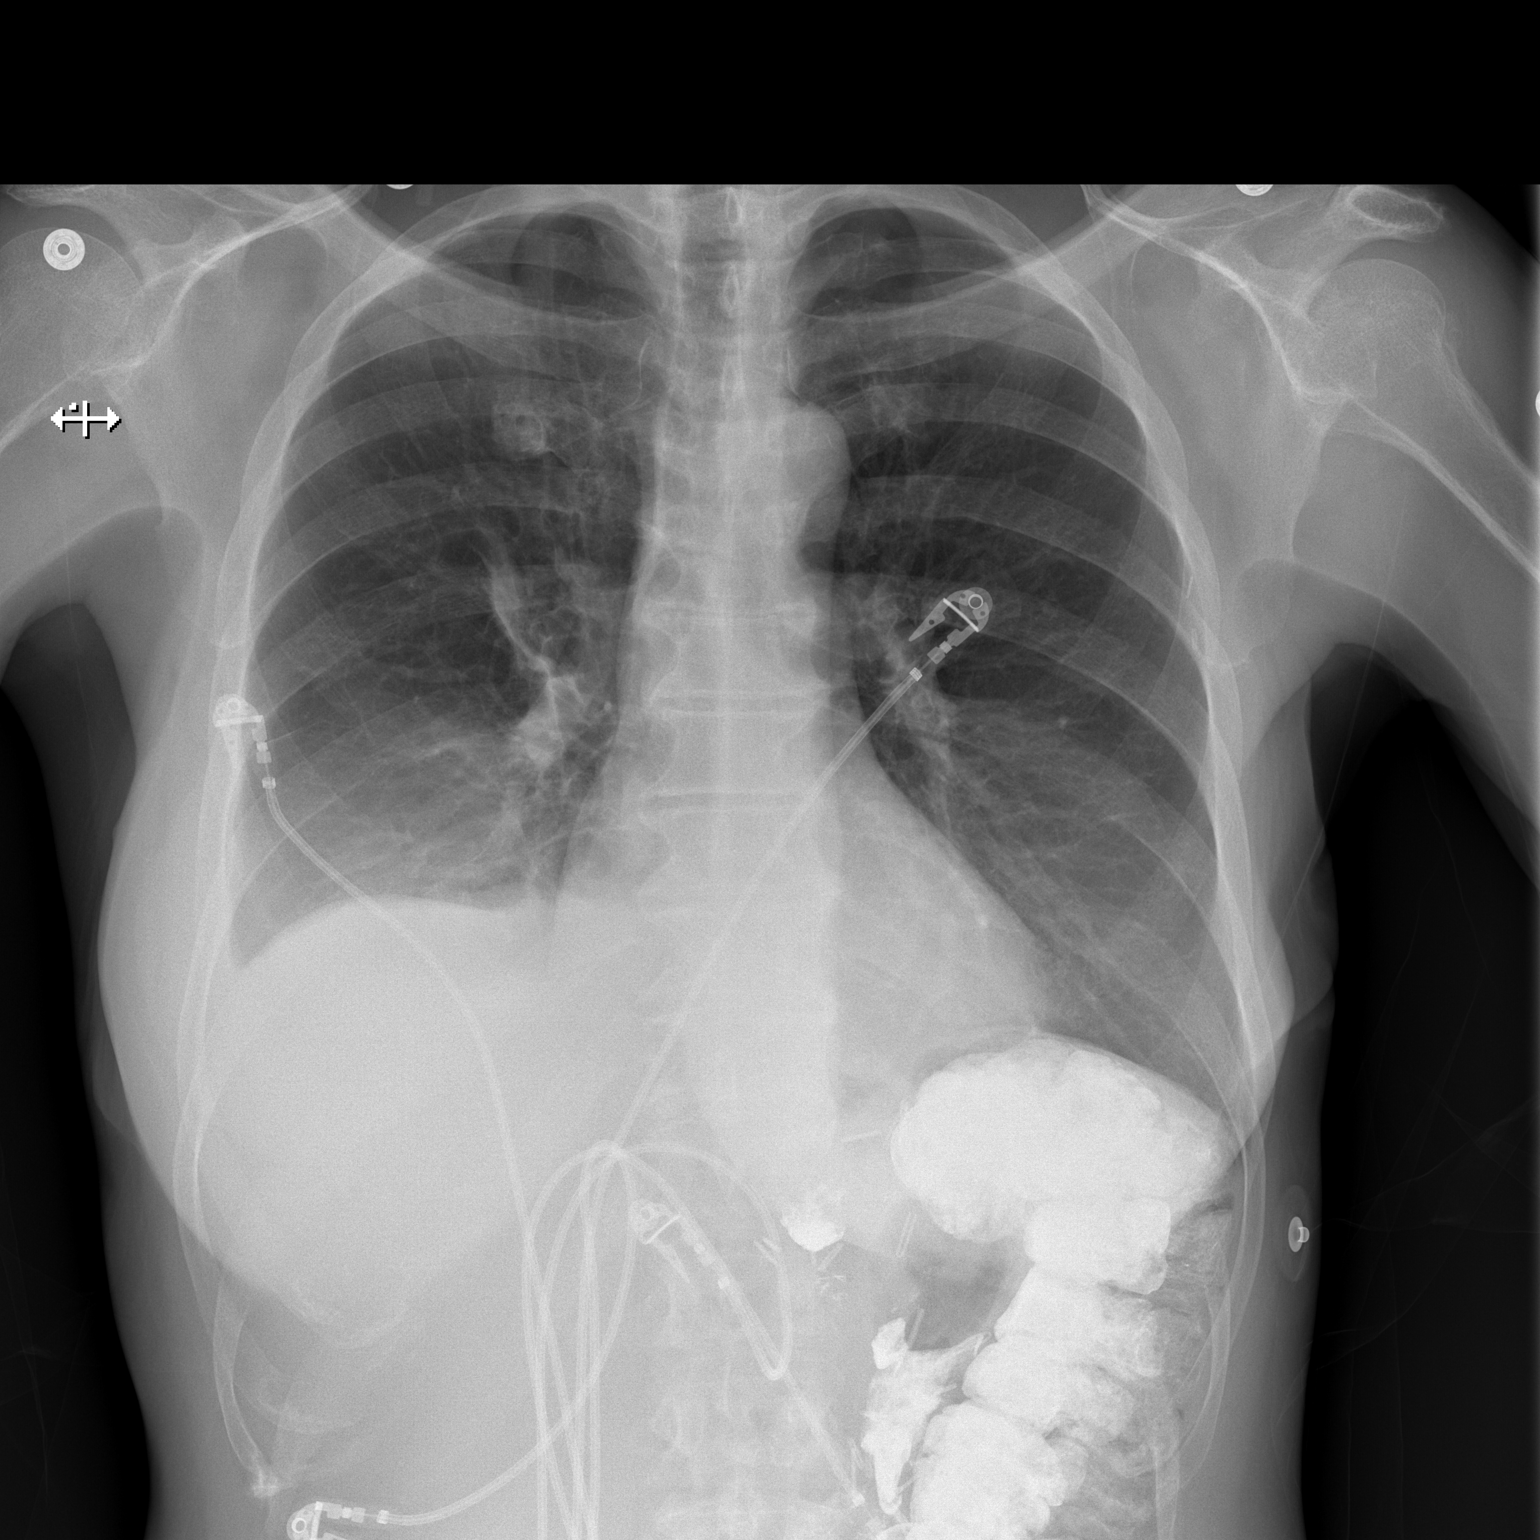
[im 2/2]
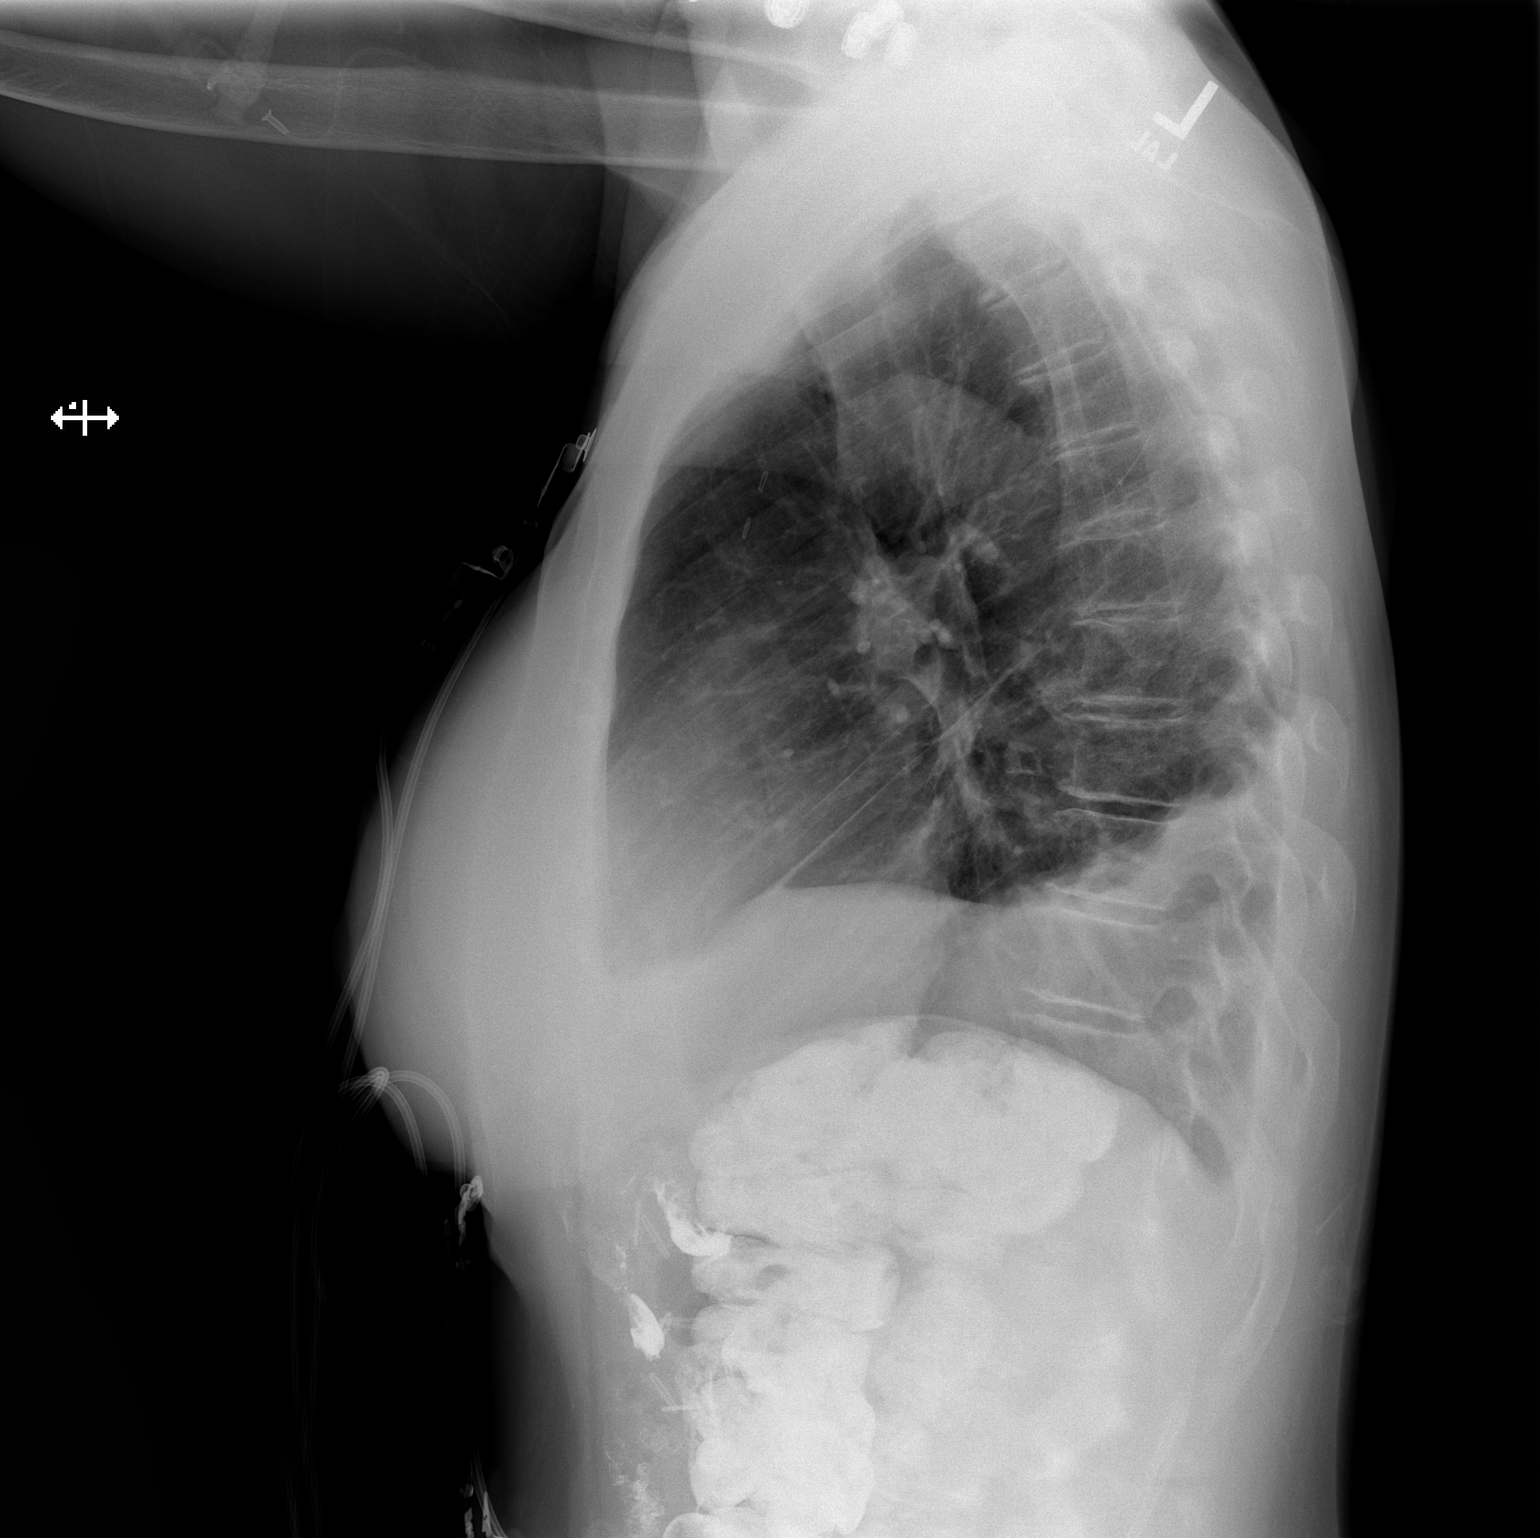

[2 of 2 positions shown; findings below may reference images not displayed]

PROCEDURE:     DXR - DXR CHEST PA (OR AP) AND LATERAL  - April 27, 2012  [DATE]

RESULT:     Comparison made to prior chest x-ray of 04/24/2012. Right pleural
effusion is present. Mild infiltrate right lung base cannot be excluded
.Left lung is clear. Heart is normal. Mediastinum is normal. Atelectatic
changes no the right perihilar region. Surgical clips in the left upper
quadrant. Degenerative changes thoracic spine.
IMPRESSION: Right pleural effusion. Right perihilar atelectasis he mild
infiltrate right lung base cannot be excluded Similar findings noted on
prior exam.

## 2014-03-06 IMAGING — RF DG BARIUM SWALLOW
2 series · 7 of 7 positions shown · non-contrast
Comparison: none

REASON FOR EXAM: dysphagia
COMMENTS:

PROCEDURE:     FL  - FL BARIUM SWALLOW  - April 27, 2012  [DATE]
RESULT:     Comparison: None.
TECHNIQUE: Standard double contrast barium swallow was performed with
monitoring via intermittent fluoroscopy.

[Series 1: fluoro_barium 2fps_bw · 0.17mm/px · 4 of 20 frames shown (1 of 2)]
[frame 4/20]
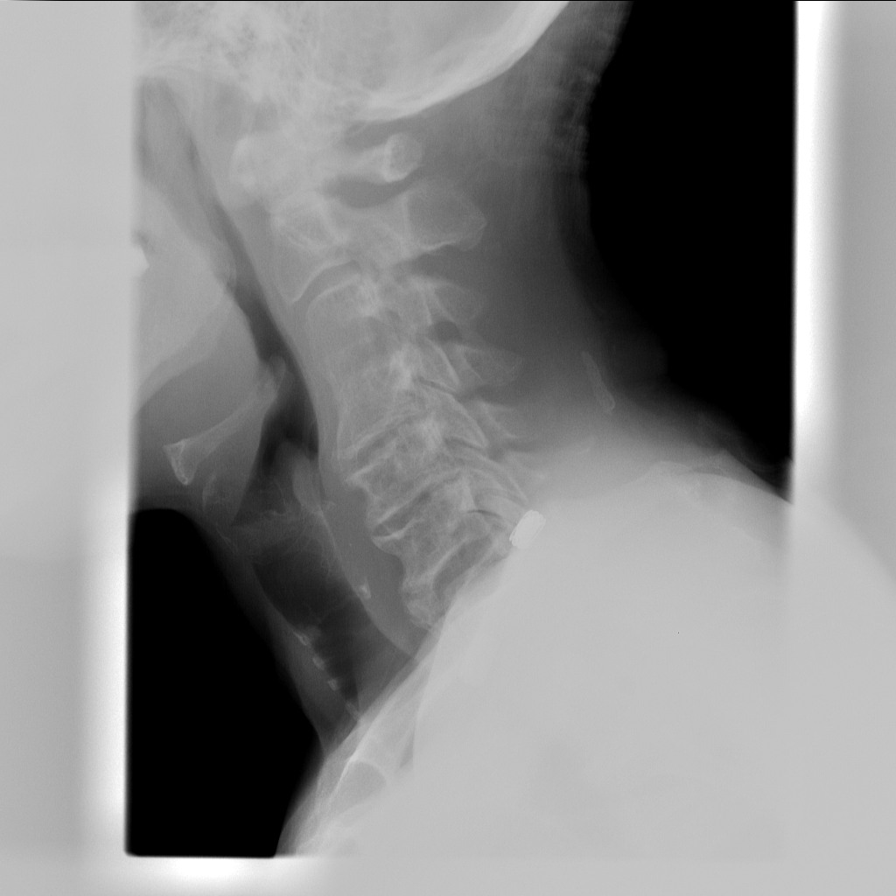
[frame 7/20]
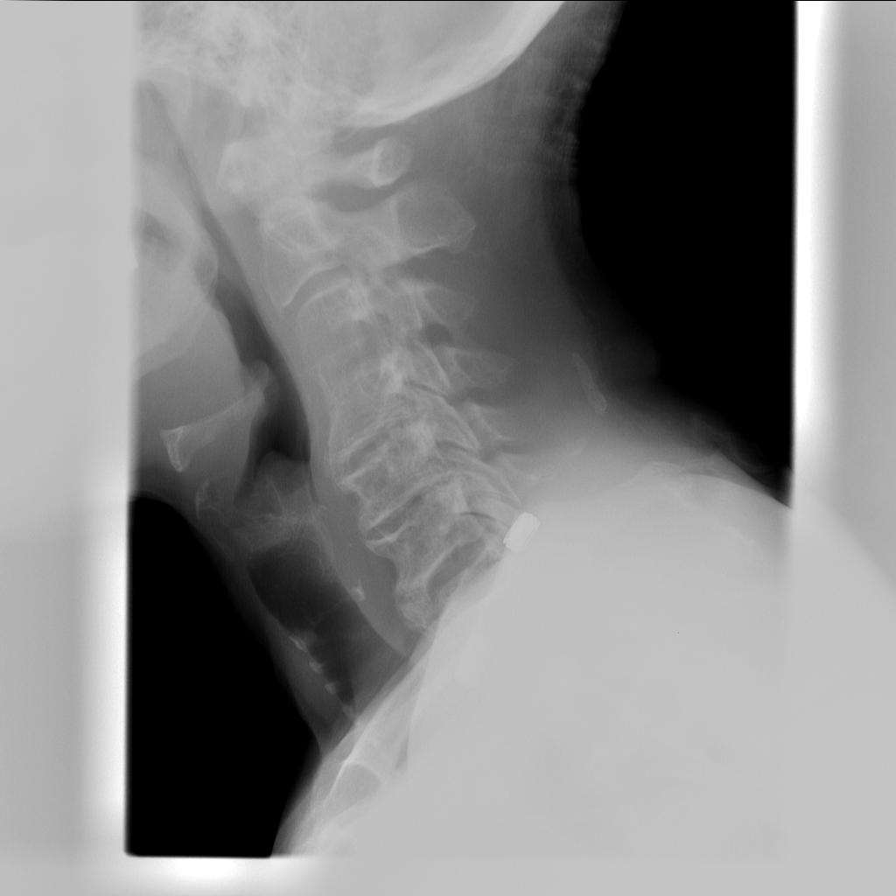
[frame 11/20]
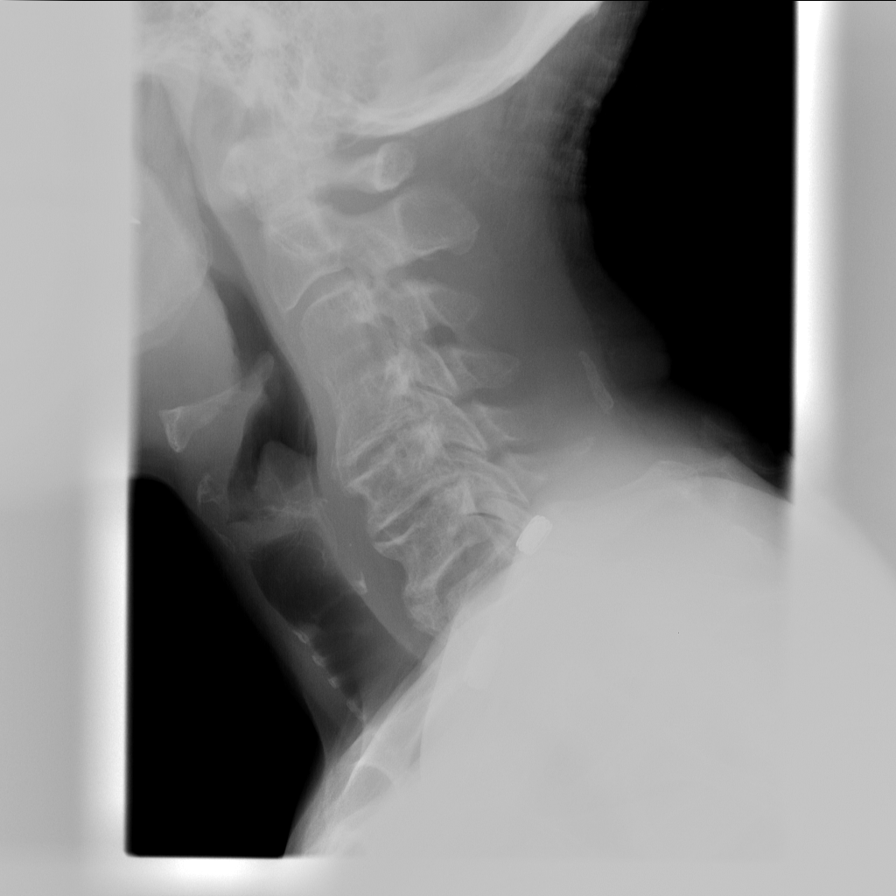
[frame 18/20]
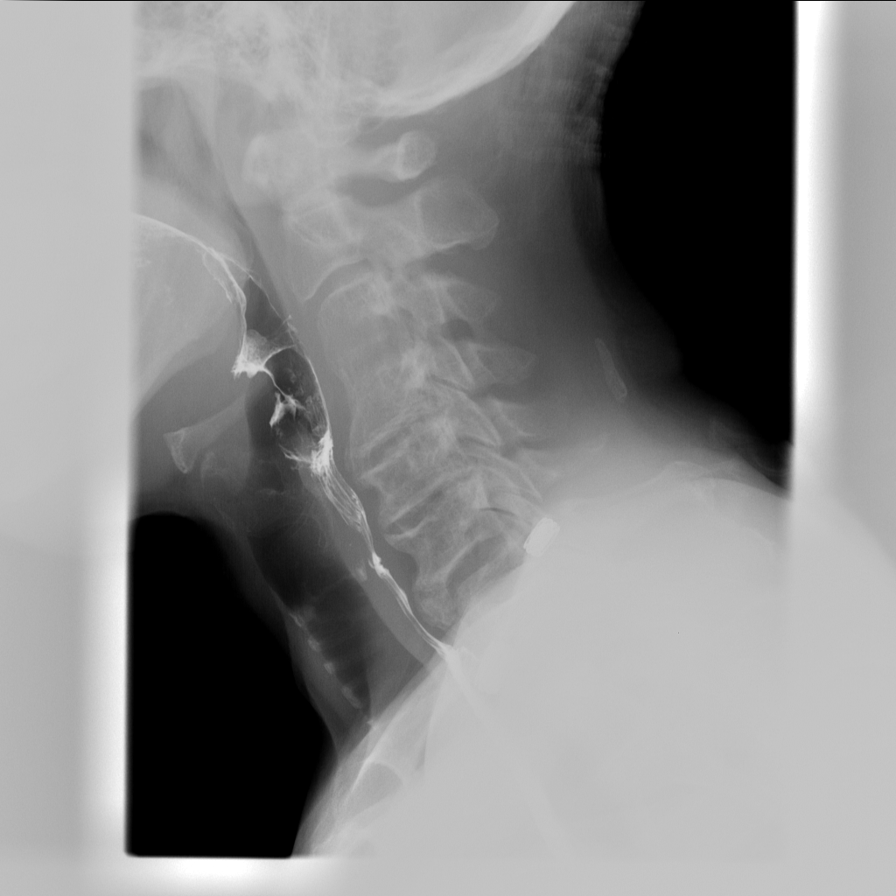

[Series 2: fluoro_barium 2fps_bw · 0.17mm/px · 3 of 18 frames shown (2 of 2)]
[frame 3/18]
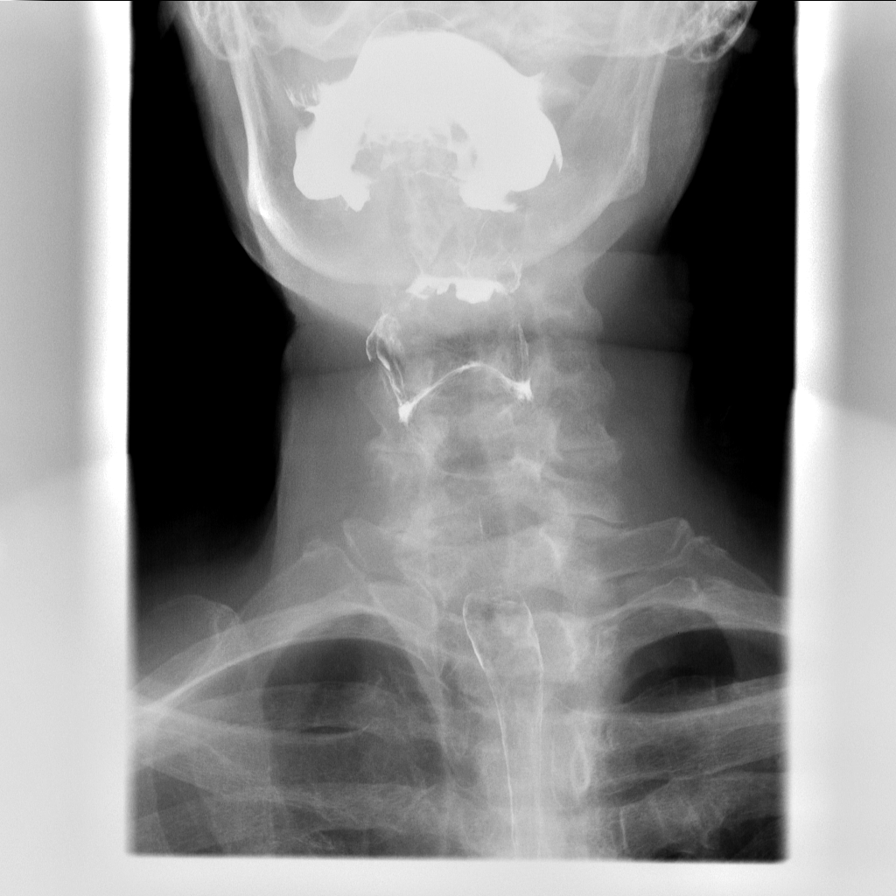
[frame 10/18]
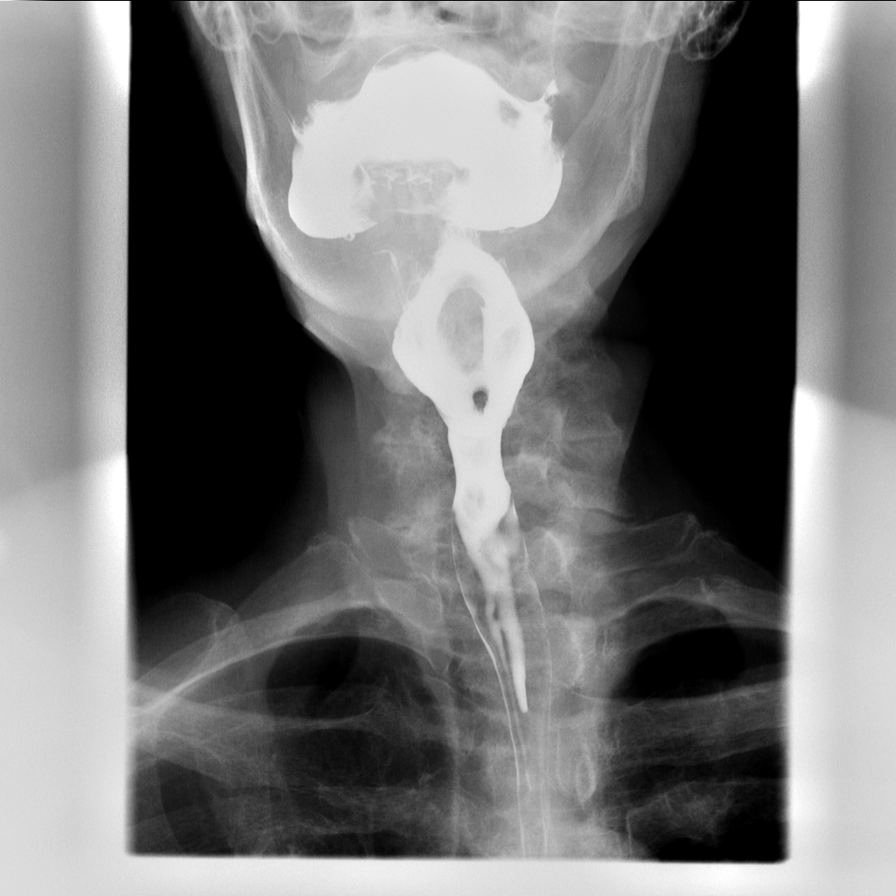
[frame 16/18]
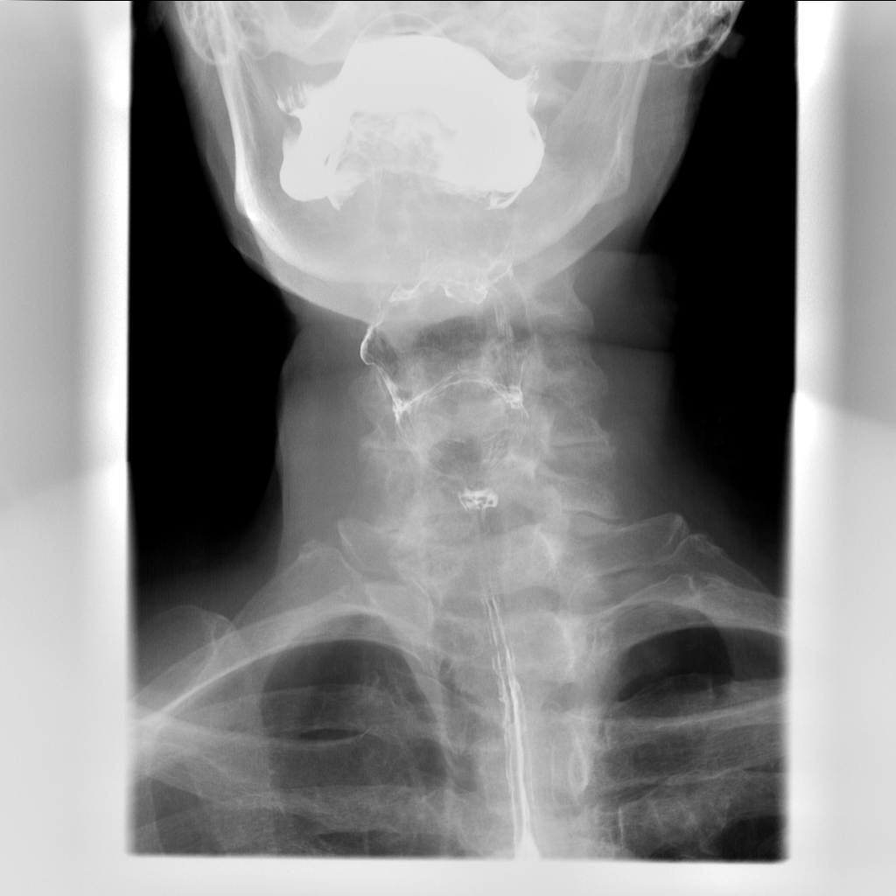

[7 of 7 positions shown; findings below may reference images not displayed]

FINDINGS: Evaluation was slightly limited secondary to limited patient mobility. There
was normal passage of barium contrast material through the hypopharynx and
into the cervical esophagus. No penetration or aspiration. The thoracic
esophagus was normal in caliber. No filling defect or ulceration identified.
There was minimal smooth narrowing of the gastroesophageal junction. This is
nonspecific. The patient swallowed a 13 mm barium tablet which passed freely
into the stomach. No gastroesophageal reflux seen during examination.
IMPRESSION: Minimal smooth narrowing of the gastroesophageal junction is nonspecific and
may be related to esophagitis. This did not prevent passage of a 13 mm
barium tablet. Further evaluation could be provided with endoscopy, as
indicated.

## 2014-05-02 NOTE — Consult Note (Signed)
PATIENT NAME:  Kim Hopkins, Kim Hopkins MR#:  [id number] DATE OF BIRTH:  10-13-1945  DATE OF CONSULTATION:  04/26/2012  REFERRING PHYSICIAN:  Demetrios Loll, MD CONSULTING PHYSICIAN:  Corky Sox. Zettie Pho, PA-C ATTENDING GASTROENTEROLOGIST: Manya Silvas, MD  REASON FOR CONSULTATION: Dysphagia.   HISTORY OF PRESENT ILLNESS: This is a pleasant 69 year old African American female who was initially advised to come into the hospital for shortness of breath and was found to be in A. fib. She has been followed by cardiology and was given amiodarone and has returned back to a normal sinus rhythm. Also of note, approximately 1 to 2 weeks prior to readmission, she did have a pneumonia. Her shortness of breath when speaking with the patient today has resolved, but she has started to mention that she has been feeling difficulty with swallowing. She reports dysphagia for the past 1 week since being in the hospital as well and feels that it is worsening lately. There is accompanying nausea without vomiting. There is no abdominal pain or epigastric discomfort, but there is a discomfort in the chest that she describes as feeling as though she needs to belch but is unable to. She has been on Protonix since admission but denies any relief with this. She did have a speech therapy eval earlier today and those results are pending. She has been tolerating a regular diet per the RN but has been complaining of some associated nausea. She has been able to keep the food down well though. There have been no unintentional weight changes. She denies any alcohol or tobacco use. She is up-to-date on colonoscopies. Lab work has been unremarkable, including her LFTs and white count. She has never had an EGD.   ALLERGIES: BACTRIM AND TAPE.   PAST MEDICAL HISTORY: Breast cancer, status post mastectomy.   PAST SURGICAL HISTORY: Left-sided mastectomy.   SOCIAL HISTORY: Denies tobacco, alcohol or illicit drug use.   FAMILY HISTORY: Negative for GI  malignancy or IBD.   HOME MEDICATIONS: Xanax, vitamin E, vitamin B, Tessalon, Lyrica, ferrous sulfate, omega-3 fatty acids, calcium and aspirin 81 mg.   REVIEW OF SYSTEMS: A 10-system review of systems was obtained on the patient. All pertinent positives are mentioned above and otherwise negative.   OBJECTIVE:  VITAL SIGNS: Blood pressure 116/62, heart rate 72, respirations 29, temperature 98.2, bedside pulse oximetry 95% on room air.  GENERAL: This is a pleasant 69 year old African American female resting quietly and comfortably in the exam room, in no acute distress, alert and oriented x 3.  HEAD: Atraumatic, normocephalic.  NECK: Supple. No lymphadenopathy noted.  HEENT: Sclerae anicteric. Mucous membranes moist.  LUNGS: Respirations are even and unlabored, clear to auscultation in bilateral anterior lung fields.  CARDIAC: Regular rate and rhythm. S1, S2 noted.  ABDOMEN: Soft, nontender, nondistended. Normoactive bowel sounds are noted in all 4 quadrants. No masses palpated. No guarding or rebound.  EXTREMITIES: Negative for lower extremity edema, 2+ pulses noted bilaterally.  PSYCHIATRIC: Appropriate mood and affect.  NEUROLOGIC: Cranial nerves II through XII grossly intact.   LABORATORY DATA: White blood cells 3.7, hemoglobin 10.5, hematocrit 31.3, platelets 271. Sodium 138, potassium 3.7, BUN 9, creatinine 0.82, glucose 131. LFTs are within normal limits. Albumin 2.9. BNP 90. MCV 83.   ASSESSMENT:  1.  Dysphagia x 1 week.  2.  Recent pneumonia.  3.  Atrial fibrillation, status post amiodarone. The patient is currently in normal sinus rhythm.   PLAN: I have discussed this patient's case in detail with Dr. Gaylyn Cheers,  who is involved in the development of the patient's plan of care. At this time, we do recommend further evaluation of her dysphagia with a barium swallow that can be done in the morning. We do also appreciate speech-language pathology evaluating the patient and we will  await their findings as well. We do recommend that the patient continue Protonix even after discharge, and we can follow up with her in the office to discuss possible intervention with an EGD should her symptoms persist. This may likely be secondary to her recent pneumonia causing increasing coughing and sputum production. Alternatives, risks and benefits of the above tests and procedures were discussed in detail and the patient verbalized understanding. She is in agreement. All questions were answered. Further recommendations pending the barium swallow and per clinical course. Diet recommendations per speech-language pathology.   Thank you so much for this consultation and for allowing [place] to participate in the patient's plan of care.   The above was discussed and agreed upon under supervisory agreement between myself and Dr. Gaylyn Cheers.  ____________________________ Corky Sox. Arik Husmann, PA-C kme:jm D: 04/26/2012 15:48:00 ET T: 04/26/2012 17:00:56 ET JOB#: [id number]  cc: Corky Sox. Tameko Halder, PA-C, <Dictator> [place] [address] [place] ELECTRONICALLY SIGNED 05/01/2012 13:16

## 2014-05-02 NOTE — Consult Note (Signed)
PATIENT NAME:  Kim Hopkins, FIDALGO MR#:  [id number] DATE OF BIRTH:  [birth year]  DATE OF CONSULTATION:  04/26/2012  REFERRING PHYSICIAN:  Demetrios Loll, MD CONSULTING PHYSICIAN:  Corky Sox. Zettie Pho, PA-C ATTENDING GASTROENTEROLOGIST: Manya Silvas, MD  REASON FOR CONSULTATION: Dysphagia.   HISTORY OF PRESENT ILLNESS: This is a pleasant 69 year old African American female who was initially advised to come into the hospital for shortness of breath and was found to be in A. fib. She has been followed by cardiology and was given amiodarone and has returned back to a normal sinus rhythm. Also of note, approximately 1 to 2 weeks prior to readmission, she did have a pneumonia. Her shortness of breath when speaking with the patient today has resolved, but she has started to mention that she has been feeling difficulty with swallowing. She reports dysphagia for the past 1 week since being in the hospital as well and feels that it is worsening lately. There is accompanying nausea without vomiting. There is no abdominal pain or epigastric discomfort, but there is a discomfort in the chest that she describes as feeling as though she needs to belch but is unable to. She has been on Protonix since admission but denies any relief with this. She did have a speech therapy eval earlier today and those results are pending. She has been tolerating a regular diet per the RN but has been complaining of some associated nausea. She has been able to keep the food down well though. There have been no unintentional weight changes. She denies any alcohol or tobacco use. She is up-to-date on colonoscopies. Lab work has been unremarkable, including her LFTs and white count. She has never had an EGD.   ALLERGIES: BACTRIM AND TAPE.   PAST MEDICAL HISTORY: Breast cancer, status post mastectomy.   PAST SURGICAL HISTORY: Left-sided mastectomy.   SOCIAL HISTORY: Denies tobacco, alcohol or illicit drug use.   FAMILY HISTORY: Negative for GI  malignancy or IBD.   HOME MEDICATIONS: Xanax, vitamin E, vitamin B, Tessalon, Lyrica, ferrous sulfate, omega-3 fatty acids, calcium and aspirin 81 mg.   REVIEW OF SYSTEMS: A 10-system review of systems was obtained on the patient. All pertinent positives are mentioned above and otherwise negative.   OBJECTIVE:  VITAL SIGNS: Blood pressure 116/62, heart rate 72, respirations 29, temperature 98.2, bedside pulse oximetry 95% on room air.  GENERAL: This is a pleasant 69 year old African American female resting quietly and comfortably in the exam room, in no acute distress, alert and oriented x 3.  HEAD: Atraumatic, normocephalic.  NECK: Supple. No lymphadenopathy noted.  HEENT: Sclerae anicteric. Mucous membranes moist.  LUNGS: Respirations are even and unlabored, clear to auscultation in bilateral anterior lung fields.  CARDIAC: Regular rate and rhythm. S1, S2 noted.  ABDOMEN: Soft, nontender, nondistended. Normoactive bowel sounds are noted in all 4 quadrants. No masses palpated. No guarding or rebound.  EXTREMITIES: Negative for lower extremity edema, 2+ pulses noted bilaterally.  PSYCHIATRIC: Appropriate mood and affect.  NEUROLOGIC: Cranial nerves II through XII grossly intact.   LABORATORY DATA: White blood cells 3.7, hemoglobin 10.5, hematocrit 31.3, platelets 271. Sodium 138, potassium 3.7, BUN 9, creatinine 0.82, glucose 131. LFTs are within normal limits. Albumin 2.9. BNP 90. MCV 83.   ASSESSMENT:  1.  Dysphagia x 1 week.  2.  Recent pneumonia.  3.  Atrial fibrillation, status post amiodarone. The patient is currently in normal sinus rhythm.   PLAN: I have discussed this patient's case in detail with Dr. Gaylyn Cheers,  who is involved in the development of the patient's plan of care. At this time, we do recommend further evaluation of her dysphagia with a barium swallow that can be done in the morning. We do also appreciate speech-language pathology evaluating the patient and we will  await their findings as well. We do recommend that the patient continue Protonix even after discharge, and we can follow up with her in the office to discuss possible intervention with an EGD should her symptoms persist. This may likely be secondary to her recent pneumonia causing increasing coughing and sputum production. Alternatives, risks and benefits of the above tests and procedures were discussed in detail and the patient verbalized understanding. She is in agreement. All questions were answered. Further recommendations pending the barium swallow and per clinical course. Diet recommendations per speech-language pathology.   Thank you so much for this consultation and for allowing [place] to participate in the patient's plan of care.   The above was discussed and agreed upon under supervisory agreement between myself and Dr. Gaylyn Cheers.  ____________________________ Corky Sox. Kaili Castille, PA-C kme:jm D: 04/26/2012 15:48:13 ET T: 04/26/2012 17:20:52 ET JOB#: [id number]  cc: Corky Sox. Amir Glaus, PA-C, <Dictator> [place] ELECTRONICALLY SIGNED 05/01/2012 13:16

## 2014-05-02 NOTE — Consult Note (Signed)
PATIENT NAME:  Kim Hopkins, CANCHE MR#:  [id number] DATE OF BIRTH:  June 28, 1945  DATE OF CONSULTATION:  04/25/2012  REFERRING PHYSICIAN:  Dr. Bridgett Larsson  CONSULTING PHYSICIAN:  Merla Riches, PA-C  REASON FOR CONSULTATION: Atrial flutter.   HISTORY OF PRESENT ILLNESS: The patient is a 69 year old African American female with a past medical history significant for breast cancer, status post mastectomy, recent pneumonia, who came in to the ED with shortness of breath, chest discomfort and palpitations that have occurred over the last few days. The patient denies any chest pain, chest pressure, heaviness, squeezing or jaw pain. She notes that when she eats, she has some pain in the right upper chest that feels like the food is "stuck in her chest." She did have a feeling that her heart was" racing" the other day and she has associated shortness of breath, coughing, orthopnea and occasional PND. She denies any dizziness, fainting or pedal edema.   PAST MEDICAL HISTORY: 1. Breast cancer.  2.  Pneumonia.   PAST SURGICAL HISTORY: Left-sided mastectomy.   ALLERGIES: BACTRIM AND TAPE.    HOME MEDICATIONS: 1.  Xanax 2 mg p.o. p.r.n. for anxiety.  2.  Vitamin E 400 international units 1 tab p.o. daily.  3.  Vitamin B12 500 mcg 1 tablet p.o. daily.  4.  Guaifenesin cough syrup as directed.  5.  Lyrica 50 mg p.o. b.i.d.  6.  Ferrous sulfate 1 tablet p.o. daily  7.  Doxycycline unknown dose as directed. 8. Omega-3 fatty acids 1 tab daily.  9.  Calcium 600 +D, 1 tablet 3 times daily.  10.  Aspirin 81 mg daily.   SOCIAL HISTORY: The patient does not smoke, drink use alcohol use any illicit drugs.   FAMILY HISTORY: The patient has a history of hypertension, diabetes in her mother.   REVIEW OF SYSTEMS:  CONSTITUTIONAL: The patient denies any fevers and chills.  EYES: The patient denies double vision, blurred vision.  ENT: The patient denies any slurred speech. She does have dysphagia.  PULMONARY: The  patient complains of shortness of breath and coughing.  CARDIOVASCULAR: The patient has had palpitations. Denies any exertional chest pain.  GASTROINTESTINAL: The patient denies abdominal pain, nausea, vomiting, diarrhea.   PHYSICAL EXAMINATION: GENERAL: This is a pleasant African American female who is not in any acute distress. She is alert and oriented x 3  VITAL SIGNS:  Temperature 97.8, heart rate is 80, respiratory rate 29, blood pressure 112/64, oxygen saturation is 99%.  HEENT:  Head is  atraumatic, normocephalic. Eyes:  Pupils are round and equal. Conjunctivae pale, pink. Ears and nose are normal to external inspection. Mouth:  Moist mucous membranes.  NECK: Supple. Trachea is midline. Thyroid is smooth and mobile. No carotid bruits.  LUNGS: No accessory muscle use and no significant adventitious breath sounds can be appreciated.  CARDIOVASCULAR: Regular rate and rhythm. No murmurs, rubs or gallops appreciated.  EXTREMITIES: Trace pedal edema bilaterally.   LABORATORY, DIAGNOSTIC AND RADIOLOGIC DATA:   EKG on admission with atrial flutter, 125 beats per minute, nonspecific ST and T wave changes.   Ultrasound with bilateral Doppler with no evidence of deep vein thrombosis within the right and left lower extremity.   CT for PE: No evidence of PE, prominent right pleural effusion.   Glucose 131, BNP 90, BUN 9, creatinine 0.82, sodium 138, potassium 3.7, chloride 101, CO2 27. Estimated GFR is greater than 60. Total protein 8.0, albumin 2.9, total bilirubin 0.5, alkaline phosphatase 84, AST 24, ALT  23. Troponin I is less than 0.02. TSH is 1.26. White blood cell count is 4.2, hemoglobin 10.5, hematocrit 32.3 and platelet count 288,000. D-dimer is greater than 6.   His echocardiogram with normal left ventricular ejection fraction 60% to 65%, trace mitral valve regurgitation, normal right ventricular systolic pressure.   ASSESSMENT/PLAN:  1.  Atrial flutter. The patient has never had  irregular heart rate or palpitations in the past. She does snore, but is unknown if she has any underlying sleep apnea that could have contributed to this rhythm. Echocardiogram with no acute abnormalities, with normal left ventricular systolic function. The patient has converted to normal sinus rhythm with her rate in the 70s on amiodarone drip, which should be continued overnight. The patient continues to remain in normal sinus. We will change her amiodarone drip to oral amiodarone.  2.  Shortness of breath. The patient has a history of recent pneumonia, unknown whether her effusion is secondary to underlying infection. Her CT scan was negative for PE and ultrasound of lower extremities negative for deep vein thrombosis. The patient is currently on Lovenox.   Thank you very much for this consultation and allowing [place] to participate in this patient's care. We will continue to follow this patient with you.   ____________________________ Merla Riches, PA-C mam:cc D: 04/25/2012 17:29:45 ET T: 04/25/2012 17:47:21 ET JOB#: [id number]  cc: Merla Riches, PA-C, <Dictator> Halina Maidens, MD Jalaina Salyers A [facility] PA ELECTRONICALLY SIGNED 05/15/2012 8:25

## 2014-05-02 NOTE — Discharge Summary (Signed)
PATIENT NAME:  Kim Hopkins, Kim Hopkins MR#:  [id number] DATE OF BIRTH:  Feb 13, 1945  DATE OF ADMISSION:  04/24/2012 DATE OF DISCHARGE:  04/28/2012  DISCHARGE DIAGNOSES:  Atrial flutter, converted to normal sinus rhythm; dysphagia due to reflux esophagitis; chronic anemia; pneumonia; chronic cough; history of breast cancer.   CONDITION ON DISCHARGE: Stable.   CODE STATUS: FULL CODE.   MEDICATIONS ON DISCHARGE: Calcium 600 mg, plus vitamin D 600 mg, 200 international units 3 times a day; aspirin 81 mg once a day, vitamin E 400 international units once a day, vitamin B12 500 mcg once a day, Lyrica 50 mg 2 times a day, Tessalon Perles 100 mg oral capsule 3 times a day as needed, ferrous sulfate 325 mg oral once a day, Xanax 2 mg every  6 hours as needed, B complex 50 orally once a day, omega-3 1000 mg oral capsule once a day, diclofenac 1% gel, apply topically to painful areas, oxycodone 30 mg one-half pill p.o. b.i.d.,  amiodarone 200 mg oral tablet once a day, rivaroxaban 20 mg oral tablet once a day, cefuroxime 500 mg oral tablet 2 times a day for 4 days, guaifenesin 10 mL oral liquid 4 to 6 hours as needed for cough, sucralfate 1 gram 3 times a day for 15 days, benzocaine  plus mint oil, topical; lozenges, every 2 hours for sore throat, pantoprazole 40 mg delayed-release tablet 2 times a day for 15 days.   TIMEFRAME TO FOLLOWUP: Within 1 to 2 weeks with Dr. Neoma Laming, cardiology, and with Dr. Vira Agar, gastroenterologist. Advised to follow with Dr. Humphrey Rolls to address the dose of cardiac medication, and with Dr. Vira Agar to check weekly for esophagitis and need of endoscopy.   HISTORY OF PRESENT ILLNESS: A 69 year old [place] American female with a history of breast cancer status post mastectomy, and pneumonia. Seven days ago came to the Emergency Room with shortness of breath and cough, chest discomfort and palpitations for a few days. She was diagnosed with pneumonia last week and was given medication and  antibiotics. She finished the antibiotic the previous day of presentation, but still was having shortness of breath, cough and chest discomfort. She was also feeling palpitations, dizziness and weakness. She was found to have atrial fibrillation at an [facility] and so was sent to the Emergency Room for further evaluation. Dr. Humphrey Rolls suggested starting amiodarone on the floor for atrial flutter.    HOSPITAL COURSE AND STAY:  1.  Atrial flutter, converted back to normal sinus rhythm: She was continued on oral amiodarone. Echocardiogram was done which showed ejection fraction of 60% to 65%. She remained in normal sinus rhythm and rate-controlled. Dr. Neoma Laming suggested to follow in the clinic within 1 to 2 weeks for cardiac medications and further requirements.  2.  She also had a complaint of dysphagia and severe pain while swallowing the food here. Barium swallowing studies were done and she was able to swallow  barium tablets. GI was following. They cleared for discharge with suggested treatment for esophagitis and followup in the clinic for possible endoscopy. She felt a little better after starting sucralfate and PPI b.i.d. Advised to follow in the clinic with Dr. Vira Agar. 3. Initially on presentation with complaint of chest pain, shortness of breath and chest palpitation, d-dimer was elevated up to 6. CT angiogram of the chest was done which was negative. There was no PE or DVT.  4.  Anemia, which was stable.  5. Pneumonia: She had complained of pneumonia a week  ago which was treated with antibiotics, but her complaints continued, and in the hospital she had a complaint of chest tightness and cough, so again was started on antibiotics and on cough suppression medications, and she felt a little better. For atrial flutter, Dr. Humphrey Rolls also started her on Xarelto and discharged on the same dose.   REPORTED LAB RESULTS IN THE HOSPITAL: Her creatinine, sodium and potassium were normal on presentation  and remained stable throughout the hospital course. Troponin level less than 0.02. Hemoglobin was 10.5, remained stable at that level.   D-dimer was more than 6, and as mentioned above a CT angiogram was done to rule out a PE, which was negative.   Echocardiogram done, which showed left ventricular size and wall thickness, normal systolic and diastolic function, ejection fraction 60% to 65%.   Total time spent on this discharge: Forty-five minutes.     ____________________________ Ceasar Lund Anselm Jungling, MD vgv:dm D: 05/02/2012 07:44:08 ET T: 05/02/2012 08:15:31 ET JOB#: [id number]  cc: Ceasar Lund. Anselm Jungling, MD, <Dictator> Dionisio David, MD Manya Silvas, MD Vaughan Basta MD ELECTRONICALLY SIGNED 05/05/2012 22:47

## 2014-05-02 NOTE — Consult Note (Signed)
Chief Complaint:  Subjective/Chief Complaint Patient with dysphagia and pneumonia. Her barium swallow showed her to have narrowing in the distal esophagus from possible inflammation. The patient has no complaints today.   VITAL SIGNS/ANCILLARY NOTES: **Vital Signs.:   18-Apr-14 04:12  Vital Signs Type Routine  Temperature Temperature (F) 98.3  Celsius 36.8  Temperature Source oral  Pulse Pulse 69  Respirations Respirations 20  Systolic BP Systolic BP 258  Diastolic BP (mmHg) Diastolic BP (mmHg) 73  Mean BP 92  Pulse Ox % Pulse Ox % 99  Pulse Ox Activity Level  At rest  Oxygen Delivery Room Air/ 21 %   Brief Assessment:  Respiratory normal resp effort   Gastrointestinal details normal Soft   Additional Physical Exam A&O x 3. NAD   Radiology Results: XRay:    18-Apr-14 08:17, Barium Swallow Diagnostic  Barium Swallow Diagnostic   REASON FOR EXAM:    dysphagia  COMMENTS:       PROCEDURE: FL  - FL BARIUM SWALLOW  - Apr 27 2012  8:17AM     RESULT: Comparison: None.    Technique: Standard double contrast barium swallow was performed with   monitoring via intermittent fluoroscopy.    Findings:  Evaluation was slightly limited secondary to limited patient mobility.   There was normal passage of barium contrast material through the   hypopharynx and into the cervical esophagus. No penetration or   aspiration. The thoracic esophagus was normal in caliber. No filling     defect or ulceration identified. There was minimal smooth narrowing of   the gastroesophageal junction. This is nonspecific. The patient swallowed   a 13 mm barium tablet which passed freely into the stomach. No   gastroesophageal reflux seen during examination.    IMPRESSION:   Minimal smooth narrowing of the gastroesophageal junction is nonspecific   and may be related to esophagitis. This did not prevent passage of a 13   mm barium tablet. Further evaluation could be provided with endoscopy, as    indicated.        Verified By: Gregor Hams, M.D., MD   Assessment/Plan:  Assessment/Plan:  Assessment Dysphagia.   Plan Barium swallow shoed a narrowing without any problem passing the 20mm barium tablet. The patietn will follow up as an out patient for possible treatment with Dr. Vira Agar.   Electronic Signatures: Lucilla Lame (MD)  (Signed 18-Apr-14 15:04)  Authored: Chief Complaint, VITAL SIGNS/ANCILLARY NOTES, Brief Assessment, Radiology Results, Assessment/Plan   Last Updated: 18-Apr-14 15:04 by Lucilla Lame (MD)

## 2014-05-02 NOTE — Consult Note (Signed)
Brief Consult Note: Diagnosis: A flutter.   Comments: Patient with new onset aflutter, palpitations, SOB. Patient denies exertional CP and has some risk factors for sleep apnea such as snoring. Echo with normal LVEF and normal wall motion. She has converted from aflutter to NSR on Amiodarone gtt which will be continued overnight. If remains in NSR then can switch to PO Amiodarone.  Electronic Signatures: Angelica Ran (MD)   (Signed 17-Apr-14 08:07)  Co-Signer: Brief Consult Note Beau Ramsburg A (PA-C)   (Signed 16-Apr-14 17:32)  Authored: Brief Consult Note  Last Updated: 17-Apr-14 08:07 by Angelica Ran (MD)

## 2014-05-02 NOTE — Consult Note (Signed)
CC: Dysphagia.  Pt with dysphagia foir meats especially, worse recently after got pneumonia.  Had atrial fib that caused hospitalization, started on Lovenox and coumadin.  For barium swallow tomorrow with tablet.  Pt still with coughing, decreased breth sounds in right base.  Will follow up in office next week if she is dischargee.  Electronic Signatures: Manya Silvas (MD)  (Signed on 17-Apr-14 17:21)  Authored  Last Updated: 17-Apr-14 17:21 by Manya Silvas (MD)

## 2014-05-02 NOTE — H&P (Signed)
PATIENT NAME:  Kim Hopkins, Kim Hopkins MR#:  [id number] DATE OF BIRTH:  May 11, 1945  DATE OF ADMISSION:  04/24/2012  PRIMARY CARE PHYSICIAN: Deborra Medina, MD  REFERRING PHYSICIAN: Graciella Freer, MD     CHIEF COMPLAINT: Shortness of breath, chest pain, palpitations today.   HISTORY OF PRESENT ILLNESS: A 69 year old African American female with a history of breast cancer, status post mastectomy and pneumonia 7 days ago, came to the ED due to shortness of breath, cough, chest discomfort and palpitations for a few days. The patient is alert, awake, oriented, in no acute distress. The patient said  she was diagnosed with pneumonia several days ago and was treated with antibiotics, and finished antibiotics yesterday, but still has shortness of breath, cough with chest discomfort.  In addition, the patient feels palpitations, dizziness and weakness. The patient also complains of bilateral leg discomfort and a sore, especially on the left leg. The patient went to urgent care and was noted to have a-fib  and was sent to the ED for further evaluation. The ED physician requested cardiology consult. Dr. Humphrey Rolls suggested start amiodarone drip for a-flutter.  Per patient, she had a long distance travel with a flight recently. Her D-dimer is more than 6 today.   PAST MEDICAL HISTORY: Breast cancer, pneumonia.   PAST SURGICAL HISTORY: Left side mastectomy.   SOCIAL HISTORY: No smoking, alcohol drinking or illicit drugs.   FAMILY HISTORY: A cousin had blood clot.  Hypertension, diabetes in her mother.   ALLERGIES: BACTRIM AND TAPE.   HOME MEDICATIONS:  1. Xanax 2 mg p.o. p.r.n. for anxiety.  2. Vitamin E 400 international units 1 cap once a day.  3. Vitamin B12 500 mcg 1 tablet once a day.  4. Tessalon 100 mg p.o. t.i.d.  5. Lyrica 50 mg p.o. b.i.d.  6. Ferrous sulfate 1 tab once a day.  7. Doxycycline 8. Omega-3 fatty acid 1 tab once a day.  9. Calcium 600+ D 600 mg 1 tablet t.i.d.  10. Aspirin 81 mg p.o. once  daily.   REVIEW OF SYSTEMS:   CONSTITUTIONAL: The patient denies any fever or chills. No headache but has dizziness, weakness.  EYES: No double vision or blurred vision.  ENT: No postnasal drip, slurred speech or dysphagia. No epistaxis.  CARDIOVASCULAR: Positive for chest discomfort, palpitations. No orthopnea or nocturnal dyspnea. No leg edema.  PULMONARY: Positive for cough, sputum, shortness of breath. No wheezing. No hemoptysis.  GASTROINTESTINAL: No abdominal pain, nausea, vomiting or diarrhea. No melena or bloody stool.  GENITOURINARY: No dysuria, hematuria or incontinence.  SKIN: No rash or jaundice.  HEMATOLOGY: No easy bleeding but has some bruises sometimes.  ENDOCRINOLOGY: No polyuria, polydipsia, heat or cold intolerance.  NEUROLOGY: No syncope, loss of consciousness or seizure.   PHYSICAL EXAMINATION: VITAL SIGNS: Blood pressure 138/65, pulse 125 bpm, O2 saturation 98% on room air.  GENERAL: The patient is alert, awake, oriented, in no acute distress.  HEENT: Pupils are round, equal and reactive to light and accommodation. Moist oral mucosa. Clear oropharynx.  NECK: Supple. No JVD or carotid bruits. No lymphadenopathy. No thyromegaly.  PULMONARY: Bilateral air entry. No wheezing or rales. No use of accessory muscles to breathe.  ABDOMEN: Soft. No distention or tenderness. No organomegaly. Bowel sounds present.  EXTREMITIES: No clubbing or cyanosis. No calf tenderness. No leg edema but has a mild sore on the left calf. Bilateral pedal pulses present.  NEUROLOGY: A and O x 3. No focal deficit. Power 5 out of 5. Sensation  intact.   LABORATORY, DIAGNOSTIC AND RADIOLOGICAL DATA:  Magnesium 2.0. Troponin 0.02. TSH 1.26. BNP 90.  CBC showed WBC 4.2, hemoglobin 10.5, platelets 288.  Glucose 131, BUN 9, creatinine 0.82.  Electrolytes normal.  D-dimer more than 6.0. Chest x-ray showed hazy opacity in the right lower lung, concerning infection.  EKG shows a-flutter with variable AV  block with a PVC. Low-voltage QRS at 125 bpm.   IMPRESSION: 1. Atrial flutter.  2. Rule out pulmonary embolus and deep vein thrombosis.  3. Breast cancer.  4. Anemia.   PLAN OF TREATMENT: 1. The patient will be admitted to a step-down. We will start Lovenox 1 mg/kg q. 12 hours. We will follow up CT chest angiogram and venous duplex,  add echocardiograph. Continue amiodarone drip per Dr. Humphrey Rolls.  2. Gastrointestinal prophylaxis.   The patient has a high risk of pulmonary embolus and deep vein thrombosis. She will be admitted to a step-down unit. I discussed the patient's critical condition and plan of treatment with the patient.   CODE STATUS:  The patient is FULL CODE.     TIME SPENT: About 62 minutes.   ____________________________ Demetrios Loll, MD qc:cb D: 04/24/2012 19:32:33 ET T: 04/24/2012 20:05:40 ET JOB#: [id number]  cc: Demetrios Loll, MD, <Dictator> Demetrios Loll MD ELECTRONICALLY SIGNED 04/27/2012 10:16

## 2014-05-04 NOTE — Consult Note (Signed)
Reason for Visit: This 69 year old Female patient presents to the clinic for initial evaluation of  Keloid .   Referred by Dr. Tula Nakayama.  Diagnosis:   Chief Complaint/Diagnosis   multiple keloids status post bilateral breast reconstruction in 70 year old female   Referral Report Clinical notes reviewed    Planned Treatment Regimen Excision of keloids and adjuvant radiation therapy to returns    HPI   patient is a 69 year old female patient of Dr. Gary Fleet who he is following for history of leukopenia. She has a history o fleft breast cancer status post lumpectomy and adjuvant radiation therapy back in 1993.she developed recurrence and underwent mastectomy in 2008 according to medical records from [facility]. She has had reconstructive surgery of both breasts with a latissimus flap performed. She has had on augmentation of her right breast. Her major concern is she has developed a keloid in the inferior mammary fold of the right breast as well as in the midline incision upper abdominal scar. Posterior back scar also has a keloid which causes significant pain. She has been examined by [facility] and he is recommended excision with adjuvant radiation therapy. She seen for consultation today. She states a scar on her back as the most painful although she is significantly concerned about the cosmetic result of the extensive keloid in her mid abdominal incision as well as the right inframammary fold. It is interesting she said other scars with no evidence of keloid formation. She has had steroid injections previously. She seen today for consultation regarding scar excision followed by radiation therapy.  Past Hx:    right breast reconstruction:    repair of ruptured cervical disc:    Mastectomy of left breast:   Past, Family and Social History:   Past Medical History positive    Cardiovascular hypertension    Cancer DCIS left breast    Endocrine diabetes mellitus     Neurological/Psychiatric Bipolar disorder    Past Surgical History Gastric bypass, cervical disc, C-section    Past Medical History Comments Fibromyalgia    Family History noncontributory    Additional Past Medical and Surgical History Seen by yourself today   Allergies:   No Known Allergies:   Home Meds:  Home Medications: Medication Instructions Status  Aromasin 25 mg oral tablet 1 tab(s) orally once a day x 30 days   Active  oxycodone 30 mg oral tablet 1 tab(s) orally every 4 hours, As Needed Active  Calcium 600+D 600 mg-200 intl units oral tablet 1 tab(s) orally 3 times a day Active   Review of Systems:   General negative    Performance Status (ECOG) 0    Skin see HPI    Breast see HPI    Ophthalmologic negative    ENMT negative    Respiratory and Thorax negative    Cardiovascular negative    Gastrointestinal negative    Genitourinary negative    Musculoskeletal negative    Neurological negative    Psychiatric negative    Hematology/Lymphatics negative    Endocrine negative    Allergic/Immunologic negative   Nursing Notes:  Nursing Vital Signs and Chemo Nursing Nursing Notes: *CC Vital Signs Flowsheet:   13-Jun-13 14:35   Temp Temperature 98.7   Respirations Respirations 18   SBP SBP 102   DBP DBP 67   Pain Scale (0-10)  7   Current Weight (kg) (kg) 74   Physical Exam:  General/Skin/HEENT:   General normal    Eyes normal  ENMT normal    Head and Neck normal    Additional PE Well-developed well-nourished female in NAD. She has had bilateral breast augmentation reconstruction. Her significant keloid scars in the right inframammary fold. She also is a significant linear keloid present in her midline abdominal incision. She also has a incision on her posterior right back with moderate keloid formation. On breast exam no dominant mass or nodularity is noted in either breast. No axillary or supraclavicular adenopathy is appreciated. Lungs  are clear to A&P.   Breasts/Resp/CV/GI/GU:   Respiratory and Thorax normal    Cardiovascular normal    Gastrointestinal normal    Genitourinary normal   MS/Neuro/Psych/Lymph:   Musculoskeletal normal    Neurological normal    Lymphatics normal   Assessment and Plan:  Impression:   multiple keloid scars in 69 year old female with multiple breast surgeries.  Plan:   at this time I have discussed the case personally with Dr.Coan . Would like to go ahead and have the 2 scars in the anterior abdominal midline and inframammary fold treated 1st. Would plan on delivering 1600 cGy in 4 fractions. Would probably use electron some abdominal incision and photons on the breast incision. We does come back in address the posterior back lesion and a separate nature we get an excellent result with the other 2 keloids scars. I would like to see the patient a week before surgery to plan the treatments and the CT simulation in anticipation of her surgery. Would also start radiation the day after her surgery and complete the treatments in one week's time. I assured her that no guarantee although my track record was treating keloids is extremely successful. We will coordinate with Dr.Coan's office to establish simulation prior to surgery.  I would like to take this opportunity to thank you for allowing me to continue to participate in this patient's care.  CC Referral:   cc: Dr.Coan [place] , Dr. Nicole Cella   Electronic Signatures: Baruch Gouty, Roda Shutters (MD)  (Signed 13-Jun-13 17:14)  Authored: HPI, Diagnosis, Past Hx, PFSH, Allergies, Home Meds, ROS, Nursing Notes, Physical Exam, Encounter Assessment and Plan, CC Referring Physician   Last Updated: 13-Jun-13 17:14 by Armstead Peaks (MD)

## 2014-05-26 ENCOUNTER — Ambulatory Visit (HOSPITAL_BASED_OUTPATIENT_CLINIC_OR_DEPARTMENT_OTHER)
Admit: 2014-05-26 | Discharge: 2014-05-26 | Disposition: A | Discharge status: Home / Self Care | Payer: Medicare Other | Attending: Infectious Disease | Admitting: Infectious Disease

## 2014-05-26 ENCOUNTER — Other Ambulatory Visit: Payer: Self-pay

## 2014-09-01 ENCOUNTER — Ambulatory Visit (HOSPITAL_BASED_OUTPATIENT_CLINIC_OR_DEPARTMENT_OTHER)
Admit: 2014-09-01 | Discharge: 2014-09-01 | Disposition: A | Discharge status: Home / Self Care | Payer: Medicare Other | Attending: Infectious Disease | Admitting: Infectious Disease

## 2014-09-01 ENCOUNTER — Other Ambulatory Visit: Payer: Self-pay | Admitting: Infectious Disease

## 2015-01-06 ENCOUNTER — Ambulatory Visit: Payer: Medicare Other | Attending: Hematology & Oncology

## 2015-01-06 ENCOUNTER — Other Ambulatory Visit: Payer: Self-pay | Admitting: Hematology & Oncology

## 2015-01-06 ENCOUNTER — Ambulatory Visit (HOSPITAL_BASED_OUTPATIENT_CLINIC_OR_DEPARTMENT_OTHER): Payer: Medicare Other

## 2015-01-06 DIAGNOSIS — C50919 Malignant neoplasm of unspecified site of unspecified female breast: Secondary | ICD-10-CM | POA: Insufficient documentation

## 2015-01-06 DIAGNOSIS — Z1231 Encounter for screening mammogram for malignant neoplasm of breast: Secondary | ICD-10-CM

## 2015-01-06 LAB — COMP METABOLIC PANEL
ALT (GPT): 9 U/L (ref 7–33)
AST (GOT): 13 U/L (ref 9–38)
Albumin/Globulin Ratio: 1.1 (ref 1.0–2.4)
Albumin: 4.1 g/dL (ref 3.5–5.2)
Alkaline Phosphatase (Total): 94 U/L (ref 38–172)
Anion Gap: 5 (ref 4–12)
Bilirubin (Total): 0.5 mg/dL (ref 0.2–1.3)
Calcium: 9.1 mg/dL (ref 8.9–10.2)
Carbon Dioxide, Total: 31 meq/L (ref 22–32)
Chloride: 105 meq/L (ref 98–108)
Creatinine: 0.75 mg/dL (ref 0.38–1.02)
GFR, Calc, African American: >60 mL/min/{1.73_m2}
GFR, Calc, European American: >60 mL/min/{1.73_m2}
Globulin: 3.6 g/dL (ref 2.0–3.9)
Glucose: 119 mg/dL (ref 62–125)
Potassium: 4 meq/L (ref 3.6–5.2)
Protein (Total): 7.7 g/dL (ref 6.0–8.2)
Sodium: 141 meq/L (ref 135–145)
Urea Nitrogen: 11 mg/dL (ref 8–21)

## 2015-01-06 LAB — CBC, DIFF
% Basophils: 1 %
% Eosinophils: 0 %
% Immature Granulocytes: 0 %
% Lymphocytes: 53 %
% Monocytes: 6 %
% Neutrophils: 40 %
Absolute Eosinophil Count: 0 10*3/uL (ref 0.00–0.50)
Absolute Lymphocyte Count: 1.54 10*3/uL (ref 1.00–4.80)
Basophils: 0.03 10*3/uL (ref 0.00–0.20)
Hematocrit: 38 % (ref 36–45)
Hemoglobin: 12.4 g/dL (ref 11.5–15.5)
Immature Granulocytes: 0 10*3/uL (ref 0.00–0.05)
MCH: 28.8 pg (ref 27.3–33.6)
MCHC: 32.5 g/dL (ref 32.2–36.5)
MCV: 88 fL (ref 81–98)
Monocytes: 0.17 10*3/uL (ref 0.00–0.80)
Neutrophils: 1.16 10*3/uL — ABNORMAL LOW (ref 1.80–7.00)
Platelet Count: 154 10*3/uL (ref 150–400)
RBC: 4.31 10*6/uL (ref 3.80–5.00)
RDW-CV: 15.6 % — ABNORMAL HIGH (ref 11.6–14.4)
WBC: 2.9 10*3/uL — ABNORMAL LOW (ref 4.3–10.0)

## 2015-01-06 LAB — CARCINOEMBRYONIC ANTIGEN: Carcinoembryonic Antigen: 3 ng/mL (ref 0.0–5.0)

## 2015-01-07 LAB — CA 15.3: CA 15.3: 22 U/mL — ABNORMAL HIGH (ref 0–20)

## 2015-12-17 ENCOUNTER — Other Ambulatory Visit (HOSPITAL_BASED_OUTPATIENT_CLINIC_OR_DEPARTMENT_OTHER): Payer: Medicare Other

## 2015-12-17 ENCOUNTER — Encounter (HOSPITAL_BASED_OUTPATIENT_CLINIC_OR_DEPARTMENT_OTHER): Payer: Medicare Other | Admitting: Medical

## 2016-01-06 ENCOUNTER — Ambulatory Visit
Admission: EM | Admit: 2016-01-06 | Discharge: 2016-01-06 | Disposition: A | Discharge status: Home / Self Care | Payer: Medicare Other | Attending: Internal Medicine | Admitting: Internal Medicine

## 2016-01-06 DIAGNOSIS — F411 Generalized anxiety disorder: Secondary | ICD-10-CM | POA: Insufficient documentation

## 2016-01-06 DIAGNOSIS — R002 Palpitations: Secondary | ICD-10-CM | POA: Diagnosis present

## 2016-01-06 DIAGNOSIS — Z981 Arthrodesis status: Secondary | ICD-10-CM | POA: Insufficient documentation

## 2016-01-06 DIAGNOSIS — F319 Bipolar disorder, unspecified: Secondary | ICD-10-CM | POA: Diagnosis not present

## 2016-01-06 DIAGNOSIS — M8580 Other specified disorders of bone density and structure, unspecified site: Secondary | ICD-10-CM | POA: Insufficient documentation

## 2016-01-06 DIAGNOSIS — R110 Nausea: Secondary | ICD-10-CM | POA: Diagnosis not present

## 2016-01-06 DIAGNOSIS — Z7982 Long term (current) use of aspirin: Secondary | ICD-10-CM | POA: Insufficient documentation

## 2016-01-06 DIAGNOSIS — M79604 Pain in right leg: Secondary | ICD-10-CM | POA: Diagnosis not present

## 2016-01-06 DIAGNOSIS — R42 Dizziness and giddiness: Secondary | ICD-10-CM | POA: Insufficient documentation

## 2016-01-06 DIAGNOSIS — Z9884 Bariatric surgery status: Secondary | ICD-10-CM | POA: Insufficient documentation

## 2016-01-06 DIAGNOSIS — Z853 Personal history of malignant neoplasm of breast: Secondary | ICD-10-CM | POA: Diagnosis not present

## 2016-01-06 DIAGNOSIS — I480 Paroxysmal atrial fibrillation: Secondary | ICD-10-CM | POA: Diagnosis not present

## 2016-01-06 DIAGNOSIS — E119 Type 2 diabetes mellitus without complications: Secondary | ICD-10-CM | POA: Diagnosis not present

## 2016-01-06 DIAGNOSIS — R001 Bradycardia, unspecified: Secondary | ICD-10-CM | POA: Insufficient documentation

## 2016-01-06 DIAGNOSIS — M797 Fibromyalgia: Secondary | ICD-10-CM | POA: Diagnosis not present

## 2016-01-06 HISTORY — DX: Unspecified atrial fibrillation: I48.91

## 2016-01-06 MED ORDER — ASPIRIN 81 MG PO CHEW: 81.0000 mg | CHEWABLE_TABLET | Freq: Every day | ORAL | 3 refills | Status: AC

## 2016-01-06 NOTE — ED Provider Notes (Signed)
[facility]    CSN: [id number] Arrival date & time: 01/06/16  1120     History   Chief Complaint Chief Complaint  Patient presents with  . Leg Pain    HPI Kim Hopkins is a 70 y.o. female.   The patient with past medical history of paroxysmal atrial fibrillation as well as breast cancer status post mastectomy presents to urgent care with multiple complaints: 1) lightheadedness with associated nausea, 2) right anterior leg pain. Also, the patient's blood pressure appear to be elevated upon arrival which the patient was concerned may have correlated to her symptoms earlier today. Her leg pain is worse at night. She states that repositioning the leg and sometimes taking it off of the bed makes it feel better briefly. She denies swelling in the leg. She admits to an anxious sensation in her chest but cannot specifically endorse palpitations area she denies chest pain or shortness of breath.      Past Medical History:  Diagnosis Date  . A-fib ([place])   . allergic rhinitis   . Bipolar affective disorder ([place])   . breast cancer 2003   s/p L breast reconst with lat dorsi flap, expander, mastopexy  . Fibromyalgia syndrome    managed with oxycodone  . Glucose intolerance (impaired glucose tolerance)    hgba1c 6.16 May 2010   . History of colonoscopy 2009   normal, next due 2019  . Keloid skin disorder   . Osteopenia   . Osteopenia 2008   T score -1.8 L hip  . Pap smear for cervical cancer screening July 2011   Normal  . S/P cervical spinal fusion 1990  . S/P gastric bypass 2003    Patient Active Problem List   Diagnosis Date Noted  . Pneumonia 01/29/2012  . Generalized anxiety disorder 01/29/2012  . Degenerative arthritis of hip 01/29/2012  . Diabetes mellitus ([place]) 01/29/2012  . Withdrawal symptoms, drug or narcotic ([place]) 07/03/2011  . breast cancer   . History of colonoscopy   . Fibromyalgia syndrome   . Osteopenia   . Cervical cancer screening  11/12/2010  . History of gastric bypass 11/12/2010  . Keloid skin disorder 11/12/2010    Past Surgical History:  Procedure Laterality Date  . CESAREAN SECTION    . GASTRIC BYPASS    . MASTECTOMY, RADICAL    . RECONSTRUCTION BREAST W/ LATISSIMUS DORSI FLAP      OB History    No data available       Home Medications    Prior to Admission medications   Medication Sig Start Date End Date Taking? Authorizing Provider  naproxen (NAPROSYN) 250 MG tablet Take by mouth as needed.   Yes Historical Provider, MD  aspirin 81 MG chewable tablet Chew 1 tablet (81 mg total) by mouth daily. 01/06/16   Harrie Foreman, MD    Family History Family History  Problem Relation Age of Onset  . Alcohol abuse Mother   . Cancer Sister     breast cancer   . Diabetes Father   . Diabetes Maternal Aunt     Social History Social History  Substance Use Topics  . Smoking status: Never Smoker  . Smokeless tobacco: Never Used  . Alcohol use No     Allergies   Bactrim [sulfamethoxazole-trimethoprim] and Tape   Review of Systems Review of Systems  Constitutional: Negative for chills and fever.  HENT: Negative for sore throat and tinnitus.   Eyes: Negative for redness.  Respiratory: Negative  for cough and shortness of breath.   Cardiovascular: Negative for chest pain and palpitations.  Gastrointestinal: Positive for nausea. Negative for abdominal pain, diarrhea and vomiting.  Genitourinary: Negative for dysuria, frequency and urgency.  Musculoskeletal: Positive for myalgias.  Skin: Negative for rash.       No lesions  Neurological: Positive for light-headedness. Negative for weakness.  Hematological: Does not bruise/bleed easily.  Psychiatric/Behavioral: Negative for suicidal ideas.     Physical Exam Triage Vital Signs ED Triage Vitals  Enc Vitals Group     BP 01/06/16 1206 (!) 102/48     Pulse Rate 01/06/16 1206 64     Resp 01/06/16 1206 18     Temp 01/06/16 1206 97.6 F (36.4  C)     Temp Source 01/06/16 1206 Oral     SpO2 01/06/16 1206 100 %     Weight 01/06/16 1203 160 lb (72.6 kg)     Height 01/06/16 1203 5\' 7"  (1.702 m)     Head Circumference --      Peak Flow --      Pain Score 01/06/16 1205 10     Pain Loc --      Pain Edu? --      Excl. in [place]? --    No data found.   Updated Vital Signs BP (!) 102/48 (BP Location: Left Arm)   Pulse 64   Temp 97.6 F (36.4 C) (Oral)   Resp 18   Ht 5\' 7"  (1.702 m)   Wt 160 lb (72.6 kg)   SpO2 100%   BMI 25.06 kg/m   Visual Acuity Right Eye Distance:   Left Eye Distance:   Bilateral Distance:    Right Eye Near:   Left Eye Near:    Bilateral Near:     Physical Exam  Constitutional: She is oriented to person, place, and time. She appears well-developed and well-nourished. No distress.  HENT:  Head: Normocephalic and atraumatic.  Mouth/Throat: Oropharynx is clear and moist.  Eyes: Conjunctivae and EOM are normal. Pupils are equal, round, and reactive to light. No scleral icterus.  Neck: Normal range of motion. Neck supple. No JVD present. Carotid bruit is not present. No tracheal deviation present. No thyromegaly present.  Cardiovascular: Normal rate, regular rhythm and normal heart sounds.  Exam reveals no gallop and no friction rub.   No murmur heard. Pulmonary/Chest: Effort normal and breath sounds normal.  Abdominal: Soft. Bowel sounds are normal. She exhibits no distension. There is no tenderness.  Musculoskeletal: Normal range of motion. She exhibits no edema.       Right ankle: She exhibits normal pulse. No tenderness.  No chords; negative Homan's   Lymphadenopathy:    She has no cervical adenopathy.  Neurological: She is alert and oriented to person, place, and time. No cranial nerve deficit.  Skin: Skin is warm and dry.  Psychiatric: She has a normal mood and affect. Her behavior is normal. Judgment and thought content normal.  Nursing note and vitals reviewed.    UC Treatments / Results   Labs (all labs ordered are listed, but only abnormal results are displayed) Labs Reviewed - No data to display  EKG  EKG Interpretation None       Radiology No results found.  Procedures Procedures (including critical care time)  Medications Ordered in UC Medications - No data to display   Initial Impression / Assessment and Plan / UC Course  I have reviewed the triage vital signs and the nursing  notes.  Pertinent labs & imaging results that were available during my care of the patient were reviewed by me and considered in my medical decision making (see chart for details).  Clinical Course     No Afib present at this time.  No s/s of DVT. BP normalized (original reading not likely to be accurate).  Possible PAD.  Encouraged pt to restart daily aspirin.    Final Clinical Impressions(s) / UC Diagnoses   Final diagnoses:  Right leg pain    New Prescriptions Discharge Medication List as of 01/06/2016  1:15 PM    START taking these medications   Details  aspirin 81 MG chewable tablet Chew 1 tablet (81 mg total) by mouth daily., Starting Wed 01/06/2016, Print         Harrie Foreman, MD 01/06/16 1350

## 2016-01-06 NOTE — ED Triage Notes (Addendum)
Pt c/o deep pain in her right leg for a few days, it seems to be worse at night when lying down. Today she feels nauseous and whoosy. Pain in leg is on the front of her shin, it isnt swollen or red and no heat. Pt BP was elevated and I had her lay back for a few minutes and rechecked it. The first pressure was 143/130, the recheck was 102/48.

## 2016-01-14 ENCOUNTER — Inpatient Hospital Stay: Payer: Self-pay

## 2016-01-20 ENCOUNTER — Ambulatory Visit (HOSPITAL_BASED_OUTPATIENT_CLINIC_OR_DEPARTMENT_OTHER): Payer: Medicare Other

## 2016-01-27 ENCOUNTER — Other Ambulatory Visit: Payer: Self-pay | Admitting: Medical

## 2016-01-27 ENCOUNTER — Ambulatory Visit: Payer: Medicare Other | Attending: Medical

## 2016-01-27 DIAGNOSIS — Z853 Personal history of malignant neoplasm of breast: Secondary | ICD-10-CM | POA: Insufficient documentation

## 2016-01-27 DIAGNOSIS — Z1231 Encounter for screening mammogram for malignant neoplasm of breast: Secondary | ICD-10-CM | POA: Insufficient documentation

## 2016-09-22 ENCOUNTER — Ambulatory Visit (INDEPENDENT_AMBULATORY_CARE_PROVIDER_SITE_OTHER): Payer: Medicare Other | Admitting: Family Medicine

## 2016-09-22 ENCOUNTER — Encounter (INDEPENDENT_AMBULATORY_CARE_PROVIDER_SITE_OTHER): Payer: Self-pay

## 2016-09-22 VITALS — BP 134/75 | HR 71 | Temp 97.8°F | Ht 67.0 in | Wt 160.0 lb

## 2016-09-22 DIAGNOSIS — Z6825 Body mass index (BMI) 25.0-25.9, adult: Secondary | ICD-10-CM

## 2016-09-22 DIAGNOSIS — M79604 Pain in right leg: Secondary | ICD-10-CM

## 2016-09-22 DIAGNOSIS — Z79899 Other long term (current) drug therapy: Secondary | ICD-10-CM

## 2016-09-22 DIAGNOSIS — F1190 Opioid use, unspecified, uncomplicated: Secondary | ICD-10-CM

## 2016-09-22 NOTE — Progress Notes (Signed)
Kara Salazar is a 71 year old female who presents to the [facility] with a Pain (Pt C/O right side of body pain x 2d, pt states fell on right side after tripping on rug 2days ago pain getting worse)  71 year old female who states that she fell at home 2 nights ago when she tripped over a carpet that her grandson had played with.  Her daughter-in-law help get her up off the floor immediately.  She states it was not particularly painful immediately after the injury.  States the following day she was up on her treadmill and walking 3 miles and did not have significant discomfort.  Over the past 24 hours however she states it's gotten very sore along the side of her hip and down her thigh.  She states it seemed to bother her to matter what position she is in.  She has not taken anything for the pain.  Reviewing her records from outside shows that she has received chronic pain medication over the past year and a decreasing schedule with oxycodone.  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  These were pulled over from her outside records but were not volunteered by the patient.  She reports recently being started on gabapentin and having taking Seroquel's for some time.  She also was given a lidocaine ointment to apply to a scar from a previous mastectomy site.    No past medical history on file.    Outpatient Prescriptions Marked as Taking for the 09/22/16 encounter (Office Visit) with Kara Sheehan, MD   Medication Sig Dispense Refill   . Gabapentin 300 MG Oral Cap Take 300 mg by mouth 2 times a day.     Marland Kitchen QUEtiapine Fumarate 50 MG Oral Tab Take 50 mg by mouth daily.         Review of patient's allergies indicates:  Allergies   Allergen Reactions   . Latex        Past Surgical History:   Procedure Laterality Date   . UNLISTED PROCEDURE BREAST  2008       Social History   Substance Use Topics   .  Smoking status: Never Smoker   . Smokeless tobacco: Never Used   . Alcohol use Yes      Comment: rarly       Parts of this medical record are completed using a dragon dictation system.    Review of Systems   Constitutional: Positive for activity change.   Musculoskeletal: Positive for gait problem and myalgias.   Skin: Negative for color change.   Psychiatric/Behavioral: Positive for sleep disturbance.       BP 134/75   Pulse 71   Temp 97.8 F (36.6 C) (Temporal)   Ht 5\' 7"  (1.702 Salazar) Comment: per pt  Wt 160 lb (72.6 kg)   SpO2 95%   BMI 25.06 kg/Salazar   Physical Exam   Constitutional: She is oriented to person, place, and time. She appears well-developed and well-nourished. She appears distressed.   Patient is a standing leaning over the top of the exam table when I entered the room.   HENT:   Head: Normocephalic and atraumatic.   Denies had chest or shoulder trauma   Pulmonary/Chest: Effort normal.   Musculoskeletal:   Inspection of the skin over her right hip and lateral right leg shows no evidence of trauma.  I see no bruising or induration.  There is no point tenderness over the bony prominences.  There is no edema or increased size of the right leg compared to the left.   Neurological: She is alert and oriented to person, place, and time.   Skin: Skin is warm and dry. No erythema.   Psychiatric: She has a normal mood and affect. Her behavior is normal. Judgment normal.   Nursing note and vitals reviewed.    (M79.604) Right leg pain  (primary encounter diagnosis)  Plan: Probable soft tissue injury to the leg.  There is no visible soft tissue bruising or induration or point tenderness however.  Advised her to just use Naprosyn or Aleve for the discomfort.  Also advised to use some hot moist compresses starting tomorrow for discomfort.    (F11.90) Chronic narcotic use  Plan: I informed her that I'Salazar aware that she is on a chronic narcotic taper and that would not want to interfere with that.    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    Kara Salazar. CMA.            You can schedule an appointment to see [place] by calling [phone] or via eCare.     If labs were ordered today the results are expected to be available via eCare 5 days later. Otherwise, result letters are mailed 7-10 days after your tests are completed. If your physician needs to change your care based on your results, you will receive a phone call to notify you. If you haven't heard from him/her and it has been more than 10 days please give [place] a call.     Thank you for choosing [facility].     Use some Aleve two tablets twice daily with food  Use some hot moist pack to the area for 20 minutes twice daily  Follow up with your PCP if the pain fails to improve

## 2016-09-22 NOTE — Patient Instructions (Addendum)
It was a pleasure to see you in clinic today.    Krimson Massmann M. CMA.            You can schedule an appointment to see [place] by calling [phone] or via eCare.     If labs were ordered today the results are expected to be available via eCare 5 days later. Otherwise, result letters are mailed 7-10 days after your tests are completed. If your physician needs to change your care based on your results, you will receive a phone call to notify you. If you haven't heard from him/her and it has been more than 10 days please give [place] a call.     Thank you for choosing [facility].     Use some Aleve two tablets twice daily with food  Use some hot moist pack to the area for 20 minutes twice daily  Follow up with your PCP if the pain fails to improve

## 2016-09-22 NOTE — Progress Notes (Signed)
Verified allergies, medication and vitals taken.   Admiral Marcucci, CMA

## 2016-12-27 ENCOUNTER — Encounter (HOSPITAL_BASED_OUTPATIENT_CLINIC_OR_DEPARTMENT_OTHER): Payer: Self-pay | Admitting: Diagnostic Radiology

## 2017-01-16 ENCOUNTER — Encounter (HOSPITAL_BASED_OUTPATIENT_CLINIC_OR_DEPARTMENT_OTHER): Payer: Self-pay | Admitting: Family Medicine

## 2017-01-16 DIAGNOSIS — Z1231 Encounter for screening mammogram for malignant neoplasm of breast: Secondary | ICD-10-CM

## 2017-02-03 ENCOUNTER — Ambulatory Visit (HOSPITAL_BASED_OUTPATIENT_CLINIC_OR_DEPARTMENT_OTHER): Payer: Medicare Other

## 2017-02-28 ENCOUNTER — Ambulatory Visit (HOSPITAL_BASED_OUTPATIENT_CLINIC_OR_DEPARTMENT_OTHER): Payer: Medicare Other

## 2017-03-17 ENCOUNTER — Ambulatory Visit: Payer: Medicare Other | Attending: Family Medicine

## 2017-03-17 DIAGNOSIS — Z1231 Encounter for screening mammogram for malignant neoplasm of breast: Secondary | ICD-10-CM

## 2017-03-17 DIAGNOSIS — Z853 Personal history of malignant neoplasm of breast: Secondary | ICD-10-CM | POA: Insufficient documentation

## 2017-09-24 LAB — OTHER LAB ORDER
Hemoglobin A1C: 6.5 % — ABNORMAL HIGH (ref 4.8–5.6)
Mean Blood Glucose Estimate: 140 mg/dL — NL

## 2018-02-17 LAB — OTHER LAB ORDER
Hemoglobin A1C: 6.8 % — ABNORMAL HIGH (ref 4.8–5.6)
Mean Blood Glucose Estimate: 148 mg/dL — NL

## 2018-06-26 ENCOUNTER — Encounter (HOSPITAL_BASED_OUTPATIENT_CLINIC_OR_DEPARTMENT_OTHER): Payer: Self-pay | Admitting: Family Medicine

## 2018-06-26 DIAGNOSIS — Z1231 Encounter for screening mammogram for malignant neoplasm of breast: Secondary | ICD-10-CM

## 2018-07-11 ENCOUNTER — Ambulatory Visit: Payer: Commercial Managed Care - HMO | Attending: Family Medicine

## 2018-07-11 DIAGNOSIS — Z1231 Encounter for screening mammogram for malignant neoplasm of breast: Secondary | ICD-10-CM | POA: Insufficient documentation

## 2019-02-22 ENCOUNTER — Inpatient Hospital Stay: Payer: Self-pay

## 2019-02-27 ENCOUNTER — Inpatient Hospital Stay: Payer: Self-pay

## 2019-07-24 ENCOUNTER — Other Ambulatory Visit (HOSPITAL_BASED_OUTPATIENT_CLINIC_OR_DEPARTMENT_OTHER): Payer: Self-pay | Admitting: Family Medicine

## 2019-07-24 DIAGNOSIS — Z1231 Encounter for screening mammogram for malignant neoplasm of breast: Secondary | ICD-10-CM

## 2019-07-30 ENCOUNTER — Encounter (HOSPITAL_BASED_OUTPATIENT_CLINIC_OR_DEPARTMENT_OTHER): Payer: Self-pay

## 2019-07-30 ENCOUNTER — Other Ambulatory Visit (HOSPITAL_BASED_OUTPATIENT_CLINIC_OR_DEPARTMENT_OTHER): Payer: Self-pay | Admitting: Family Medicine

## 2019-07-30 ENCOUNTER — Ambulatory Visit
Admit: 2019-07-30 | Discharge: 2019-07-30 | Disposition: A | Discharge status: Home / Self Care | Payer: Medicare HMO | Attending: Diagnostic Radiology | Admitting: Diagnostic Radiology

## 2019-07-30 DIAGNOSIS — Z1231 Encounter for screening mammogram for malignant neoplasm of breast: Secondary | ICD-10-CM

## 2019-07-30 HISTORY — DX: Malignant neoplasm of unspecified site of unspecified female breast: C50.919

## 2019-09-02 ENCOUNTER — Emergency Department: Payer: Self-pay

## 2020-09-09 ENCOUNTER — Other Ambulatory Visit (HOSPITAL_BASED_OUTPATIENT_CLINIC_OR_DEPARTMENT_OTHER): Payer: Self-pay | Admitting: Family Medicine

## 2020-09-09 DIAGNOSIS — Z1231 Encounter for screening mammogram for malignant neoplasm of breast: Secondary | ICD-10-CM

## 2020-10-16 ENCOUNTER — Ambulatory Visit (HOSPITAL_BASED_OUTPATIENT_CLINIC_OR_DEPARTMENT_OTHER): Payer: Medicare HMO

## 2020-12-24 ENCOUNTER — Ambulatory Visit
Admission: RE | Admit: 2020-12-24 | Discharge: 2020-12-24 | Disposition: A | Discharge status: Home / Self Care | Payer: Medicare HMO | Attending: Body Imaging | Admitting: Body Imaging

## 2020-12-24 ENCOUNTER — Encounter (HOSPITAL_BASED_OUTPATIENT_CLINIC_OR_DEPARTMENT_OTHER): Payer: Self-pay

## 2020-12-24 DIAGNOSIS — Z1231 Encounter for screening mammogram for malignant neoplasm of breast: Secondary | ICD-10-CM | POA: Insufficient documentation

## 2021-08-29 ENCOUNTER — Encounter (HOSPITAL_BASED_OUTPATIENT_CLINIC_OR_DEPARTMENT_OTHER): Payer: Self-pay

## 2021-12-13 ENCOUNTER — Other Ambulatory Visit (HOSPITAL_BASED_OUTPATIENT_CLINIC_OR_DEPARTMENT_OTHER): Payer: Self-pay | Admitting: Family Medicine

## 2021-12-13 DIAGNOSIS — Z1231 Encounter for screening mammogram for malignant neoplasm of breast: Secondary | ICD-10-CM

## 2021-12-28 ENCOUNTER — Ambulatory Visit (HOSPITAL_BASED_OUTPATIENT_CLINIC_OR_DEPARTMENT_OTHER): Payer: Medicare HMO

## 2022-01-05 ENCOUNTER — Encounter (HOSPITAL_BASED_OUTPATIENT_CLINIC_OR_DEPARTMENT_OTHER): Payer: Self-pay

## 2022-01-07 ENCOUNTER — Encounter (HOSPITAL_BASED_OUTPATIENT_CLINIC_OR_DEPARTMENT_OTHER): Payer: Self-pay

## 2022-01-07 ENCOUNTER — Other Ambulatory Visit (HOSPITAL_BASED_OUTPATIENT_CLINIC_OR_DEPARTMENT_OTHER): Payer: Self-pay | Admitting: Family Medicine

## 2022-01-07 ENCOUNTER — Ambulatory Visit
Admit: 2022-01-07 | Discharge: 2022-01-07 | Disposition: A | Discharge status: Home / Self Care | Payer: Medicare HMO | Attending: Diagnostic Radiology | Admitting: Diagnostic Radiology

## 2022-01-07 DIAGNOSIS — Z1231 Encounter for screening mammogram for malignant neoplasm of breast: Secondary | ICD-10-CM | POA: Insufficient documentation

## 2022-08-16 LAB — CYTOPATHOLOGY NON-GYN

## 2022-11-17 LAB — PATHOLOGY, SURGICAL
